# Patient Record
Sex: Female | Born: 1960 | Race: Black or African American | Hispanic: No | Smoking: Never smoker
Health system: Southern US, Community
[De-identification: ages and names within clinical notes are randomized; demographics above are authoritative.]

## PROBLEM LIST (undated history)

## (undated) DIAGNOSIS — Z972 Presence of dental prosthetic device (complete) (partial): Secondary | ICD-10-CM

## (undated) DIAGNOSIS — D259 Leiomyoma of uterus, unspecified: Secondary | ICD-10-CM

## (undated) DIAGNOSIS — R0981 Nasal congestion: Secondary | ICD-10-CM

## (undated) DIAGNOSIS — R002 Palpitations: Secondary | ICD-10-CM

## (undated) DIAGNOSIS — M1990 Unspecified osteoarthritis, unspecified site: Secondary | ICD-10-CM

## (undated) DIAGNOSIS — D649 Anemia, unspecified: Secondary | ICD-10-CM

## (undated) DIAGNOSIS — E119 Type 2 diabetes mellitus without complications: Secondary | ICD-10-CM

## (undated) DIAGNOSIS — I10 Essential (primary) hypertension: Secondary | ICD-10-CM

## (undated) HISTORY — DX: Leiomyoma of uterus, unspecified: D25.9

## (undated) HISTORY — PX: APPENDECTOMY: SHX54

## (undated) HISTORY — PX: UTERINE FIBROID SURGERY: SHX826

---

## 2006-02-26 ENCOUNTER — Other Ambulatory Visit: Payer: Self-pay

## 2006-02-27 ENCOUNTER — Inpatient Hospital Stay: Payer: Self-pay | Admitting: Obstetrics and Gynecology

## 2008-01-19 ENCOUNTER — Ambulatory Visit: Payer: Self-pay | Admitting: Internal Medicine

## 2008-01-21 ENCOUNTER — Ambulatory Visit: Payer: Self-pay | Admitting: Internal Medicine

## 2008-03-17 ENCOUNTER — Other Ambulatory Visit: Payer: Self-pay

## 2008-03-17 ENCOUNTER — Emergency Department: Payer: Self-pay | Admitting: Emergency Medicine

## 2009-01-10 ENCOUNTER — Ambulatory Visit: Payer: Self-pay | Admitting: Obstetrics and Gynecology

## 2011-03-12 ENCOUNTER — Emergency Department: Payer: Self-pay | Admitting: Emergency Medicine

## 2011-03-22 ENCOUNTER — Emergency Department: Payer: Self-pay | Admitting: Emergency Medicine

## 2015-01-10 DIAGNOSIS — M170 Bilateral primary osteoarthritis of knee: Secondary | ICD-10-CM | POA: Insufficient documentation

## 2015-02-07 ENCOUNTER — Emergency Department: Payer: Self-pay | Admitting: Student

## 2015-02-16 ENCOUNTER — Ambulatory Visit: Payer: Self-pay | Admitting: Family Medicine

## 2015-02-18 ENCOUNTER — Encounter (HOSPITAL_COMMUNITY): Payer: Self-pay | Admitting: Emergency Medicine

## 2015-02-18 ENCOUNTER — Emergency Department (HOSPITAL_COMMUNITY)
Admission: EM | Admit: 2015-02-18 | Discharge: 2015-02-18 | Disposition: A | Discharge status: Home / Self Care | Payer: BLUE CROSS/BLUE SHIELD | Attending: Emergency Medicine | Admitting: Emergency Medicine

## 2015-02-18 ENCOUNTER — Emergency Department (HOSPITAL_COMMUNITY): Payer: BLUE CROSS/BLUE SHIELD

## 2015-02-18 DIAGNOSIS — J069 Acute upper respiratory infection, unspecified: Secondary | ICD-10-CM | POA: Diagnosis not present

## 2015-02-18 DIAGNOSIS — R739 Hyperglycemia, unspecified: Secondary | ICD-10-CM

## 2015-02-18 DIAGNOSIS — E1165 Type 2 diabetes mellitus with hyperglycemia: Secondary | ICD-10-CM | POA: Insufficient documentation

## 2015-02-18 DIAGNOSIS — R7989 Other specified abnormal findings of blood chemistry: Secondary | ICD-10-CM

## 2015-02-18 DIAGNOSIS — R05 Cough: Secondary | ICD-10-CM

## 2015-02-18 DIAGNOSIS — R059 Cough, unspecified: Secondary | ICD-10-CM

## 2015-02-18 DIAGNOSIS — E876 Hypokalemia: Secondary | ICD-10-CM

## 2015-02-18 DIAGNOSIS — I10 Essential (primary) hypertension: Secondary | ICD-10-CM | POA: Insufficient documentation

## 2015-02-18 DIAGNOSIS — R945 Abnormal results of liver function studies: Secondary | ICD-10-CM

## 2015-02-18 HISTORY — DX: Type 2 diabetes mellitus without complications: E11.9

## 2015-02-18 HISTORY — DX: Essential (primary) hypertension: I10

## 2015-02-18 LAB — COMPREHENSIVE METABOLIC PANEL
ALT: 91 U/L — ABNORMAL HIGH (ref 0–35)
AST: 101 U/L — ABNORMAL HIGH (ref 0–37)
Albumin: 3.4 g/dL — ABNORMAL LOW (ref 3.5–5.2)
Alkaline Phosphatase: 65 U/L (ref 39–117)
Anion gap: 6 (ref 5–15)
BUN: 9 mg/dL (ref 6–23)
CO2: 28 mmol/L (ref 19–32)
Calcium: 8.9 mg/dL (ref 8.4–10.5)
Chloride: 98 mmol/L (ref 96–112)
Creatinine, Ser: 0.67 mg/dL (ref 0.50–1.10)
GFR calc Af Amer: >90 mL/min (ref >90)
GFR calc non Af Amer: >90 mL/min (ref >90)
Glucose, Bld: 243 mg/dL — ABNORMAL HIGH (ref 70–99)
Potassium: 3.2 mmol/L — ABNORMAL LOW (ref 3.5–5.1)
Sodium: 132 mmol/L — ABNORMAL LOW (ref 135–145)
Total Bilirubin: 0.7 mg/dL (ref 0.3–1.2)
Total Protein: 7 g/dL (ref 6.0–8.3)

## 2015-02-18 LAB — CBC WITH DIFFERENTIAL/PLATELET
Basophils Absolute: 0 10*3/uL (ref 0.0–0.1)
Basophils Relative: 1 % (ref 0–1)
Eosinophils Absolute: 0 10*3/uL (ref 0.0–0.7)
Eosinophils Relative: 0 % (ref 0–5)
HCT: 33.3 % — ABNORMAL LOW (ref 36.0–46.0)
Hemoglobin: 11.5 g/dL — ABNORMAL LOW (ref 12.0–15.0)
Lymphocytes Relative: 47 % — ABNORMAL HIGH (ref 12–46)
Lymphs Abs: 2.1 10*3/uL (ref 0.7–4.0)
MCH: 30.6 pg (ref 26.0–34.0)
MCHC: 34.5 g/dL (ref 30.0–36.0)
MCV: 88.6 fL (ref 78.0–100.0)
Monocytes Absolute: 0.5 10*3/uL (ref 0.1–1.0)
Monocytes Relative: 12 % (ref 3–12)
Neutro Abs: 1.8 10*3/uL (ref 1.7–7.7)
Neutrophils Relative %: 40 % — ABNORMAL LOW (ref 43–77)
Platelets: 146 10*3/uL — ABNORMAL LOW (ref 150–400)
RBC: 3.76 MIL/uL — ABNORMAL LOW (ref 3.87–5.11)
RDW: 12.6 % (ref 11.5–15.5)
WBC: 4.4 10*3/uL (ref 4.0–10.5)

## 2015-02-18 LAB — LIPASE, BLOOD: Lipase: 34 U/L (ref 11–59)

## 2015-02-18 MED ORDER — ONDANSETRON HCL 4 MG/2ML IJ SOLN
4.0000 mg | Freq: Once | INTRAMUSCULAR | Status: AC
  Administered 2015-02-18: 4 mg via INTRAVENOUS
  Filled 2015-02-18: qty 2

## 2015-02-18 MED ORDER — ONDANSETRON HCL 4 MG PO TABS: 4.0000 mg | ORAL_TABLET | Freq: Four times a day (QID) | ORAL | Status: DC

## 2015-02-18 MED ORDER — ALBUTEROL SULFATE (2.5 MG/3ML) 0.083% IN NEBU
5.0000 mg | INHALATION_SOLUTION | Freq: Once | RESPIRATORY_TRACT | Status: AC
  Administered 2015-02-18: 5 mg via RESPIRATORY_TRACT
  Filled 2015-02-18: qty 6

## 2015-02-18 MED ORDER — IPRATROPIUM BROMIDE 0.02 % IN SOLN
0.5000 mg | Freq: Once | RESPIRATORY_TRACT | Status: AC
  Administered 2015-02-18: 0.5 mg via RESPIRATORY_TRACT
  Filled 2015-02-18: qty 2.5

## 2015-02-18 MED ORDER — ALBUTEROL SULFATE HFA 108 (90 BASE) MCG/ACT IN AERS
2.0000 | INHALATION_SPRAY | RESPIRATORY_TRACT | Status: DC | PRN
  Administered 2015-02-18: 2 via RESPIRATORY_TRACT
  Filled 2015-02-18: qty 6.7

## 2015-02-18 MED ORDER — POTASSIUM CHLORIDE CRYS ER 20 MEQ PO TBCR
40.0000 meq | EXTENDED_RELEASE_TABLET | Freq: Once | ORAL | Status: AC
  Administered 2015-02-18: 40 meq via ORAL
  Filled 2015-02-18: qty 2

## 2015-02-18 MED ORDER — SODIUM CHLORIDE 0.9 % IV BOLUS (SEPSIS)
1000.0000 mL | Freq: Once | INTRAVENOUS | Status: AC
  Administered 2015-02-18: 1000 mL via INTRAVENOUS

## 2015-02-18 NOTE — Discharge Instructions (Signed)
Stop taking your simvostatin ( your cholesterol ) pill.  It is causing your liver tests to become    Read the instructions below on reasons to return to the emergency department and to learn more about your diagnosis.  Use over the counter medications for symptomatic relief as we discussed (musinex as a decongestant, Tylenol for fever/pain, Motrin/Ibuprofen for muscle aches). If prescribed a cough suppressant during your visit, do not operate heavy machinery with in 5 hours of taking this medication. Followup with your primary care doctor in 4 days if your symptoms persist.  Your more than welcome to return to the emergency department if symptoms worsen or become concerning.  Upper Respiratory Infection, Adult  An upper respiratory infection (URI) is also sometimes known as the common cold. Most people improve within 1 week, but symptoms can last up to 2 weeks. A residual cough may last even longer.   URI is most commonly caused by a virus. Viruses are NOT treated with antibiotics. You can easily spread the virus to others by oral contact. This includes kissing, sharing a glass, coughing, or sneezing. Touching your mouth or nose and then touching a surface, which is then touched by another person, can also spread the virus.   TREATMENT  Treatment is directed at relieving symptoms. There is no cure. Antibiotics are not effective, because the infection is caused by a virus, not by bacteria. Treatment may include:  Increased fluid intake. Sports drinks offer valuable electrolytes, sugars, and fluids.  Breathing heated mist or steam (vaporizer or shower).  Eating chicken soup or other clear broths, and maintaining good nutrition.  Getting plenty of rest.  Using gargles or lozenges for comfort.  Controlling fevers with ibuprofen or acetaminophen as directed by your caregiver.  Increasing usage of your inhaler if you have asthma.  Return to work when your temperature has returned to normal.   SEEK  MEDICAL CARE IF:  After the first few days, you feel you are getting worse rather than better.  You develop worsening shortness of breath, or brown or red sputum. These may be signs of pneumonia.  You develop yellow or brown nasal discharge or pain in the face, especially when you bend forward. These may be signs of sinusitis.  You develop a fever, swollen neck glands, pain with swallowing, or white areas in the back of your throat. These may be signs of strep throat.

## 2015-02-18 NOTE — ED Notes (Signed)
PA at the bedside.

## 2015-02-18 NOTE — ED Notes (Signed)
Patient returned from [place]. Debbie, NT at the bedside placing patient back on the monitor.

## 2015-02-18 NOTE — ED Provider Notes (Signed)
CSN: [id number]     Arrival date & time 02/18/15  1202 History   First MD Initiated Contact with Patient 02/18/15 1212     Chief Complaint  Patient presents with  . Cough     (Consider location/radiation/quality/duration/timing/severity/associated sxs/prior Treatment) HPI Comments: Patient presents today with a chief complaint of cough.  She reports that the cough has been present for the past 6 days and is gradually worsening.  She states that the cough is dry at times and productive of whitish colored sputum at times.  She states that she was seen at an Urgent Care two days ago and diagnosed with Pneumonia clinically and started on Levaquin.  She states that she did not have a CXR at that time.  She states that she has been taking Levaquin as directed.   She sates that she has had an intermittent fever over the past 6 days, but has not actually taken her temperature. She denies any chest pain, hemoptysis, or LE edema.  She does report mild SOB with exertion over the past 6 days.   She denies any history of COPD or Asthma.  She does not smoke and reports that she never has.  No history of DVT or PE.  Patient also reports that she has had nausea, vomiting, and diarrhea over the past 5 days.  She reports 3-4 episodes of diarrhea daily and 1-2 episodes of vomiting daily.  No blood in her emesis or blood in her stool.  She denies abdominal pain.  Denies urinary symptoms or vaginal discharge.    The history is provided by the patient.    Past Medical History  Diagnosis Date  . Diabetes mellitus without complication   . Hypertension    History reviewed. No pertinent past surgical history. History reviewed. No pertinent family history. History  Substance Use Topics  . Smoking status: Never Smoker   . Smokeless tobacco: Not on file  . Alcohol Use: No   OB History    No data available     Review of Systems  All other systems reviewed and are negative.     Allergies  Review of  patient's allergies indicates no known allergies.  Home Medications   Prior to Admission medications   Not on File   BP 126/63 mmHg  Pulse 90  Temp(Src) 98.6 F (37 C) (Oral)  Resp 16  SpO2 97% Physical Exam  Constitutional: She appears well-developed and well-nourished.  HENT:  Head: Normocephalic and atraumatic.  Mouth/Throat: Oropharynx is clear and moist.  Neck: Normal range of motion.  Cardiovascular: Normal rate, regular rhythm and normal heart sounds.   Pulmonary/Chest: Effort normal. No accessory muscle usage. No tachypnea. No respiratory distress. She has decreased breath sounds.  Breath sounds decreased with rhonchi at the bases of both lungs  Abdominal: Soft. Bowel sounds are normal. She exhibits no distension and no mass. There is no tenderness. There is no rebound and no guarding.  Musculoskeletal: Normal range of motion.  Neurological: She is alert.  Skin: Skin is warm and dry.  Nursing note and vitals reviewed.   ED Course  Procedures (including critical care time) Labs Review Labs Reviewed  CBC WITH DIFFERENTIAL/PLATELET - Abnormal; Notable for the following:    RBC 3.76 (*)    Hemoglobin 11.5 (*)    HCT 33.3 (*)    Platelets 146 (*)    Neutrophils Relative % 40 (*)    Lymphocytes Relative 47 (*)    All other components within normal  limits  COMPREHENSIVE METABOLIC PANEL - Abnormal; Notable for the following:    Sodium 132 (*)    Potassium 3.2 (*)    Glucose, Bld 243 (*)    Albumin 3.4 (*)    AST 101 (*)    ALT 91 (*)    All other components within normal limits  LIPASE, BLOOD    Imaging Review No results found.   EKG Interpretation None     3:00 PM Patient reports that SOB has improved after breathing treatment.  Lungs CTAB.  Abdomen is soft and non tender.  Patient tolerating PO liquids.  MDM   Final diagnoses:  Cough  Patient presents today with a cough x 6 days.  CXR negative.  No hypoxia or signs of respiratory distress.  Breath  sounds decreased with some rhonchi on exam.  Symptoms improved after given Albuterol.  She is currently being treated with Levaquin for the past 3 days for suspected Pneumonia.  Patient also complaining of nausea, vomiting, and diarrhea.  Abdomen soft and non tender.  Labs showing no leukocytosis, but mild Hypokalemia and hyperglycemia.  Anion gap is 6.  LFT's elevated.  Patient was recently started on Simvastatin on 01/25/15.  Suspect this is the reason for the elevated LFT's.  Patient instructed to stop taking the Simvastatin and take a baby aspirin daily.  Instructed to follow up with PCP regarding this.  Nausea improved during ED course.  Patient able to tolerate PO liquids.  Feel that the patient is stable for discharge.      Hyman Bible, PA-C 02/20/15 2321  Dot Lanes, MD 02/27/15 [phone]

## 2015-02-18 NOTE — ED Notes (Signed)
Pt sts flu like sx and cough x 1 week; crackles noted and pt sts some fever at times

## 2015-02-18 NOTE — ED Notes (Signed)
PA student at the bedside.  

## 2015-03-10 ENCOUNTER — Ambulatory Visit: Payer: Self-pay | Admitting: Internal Medicine

## 2015-08-15 ENCOUNTER — Encounter: Payer: Self-pay | Admitting: Podiatry

## 2015-08-15 ENCOUNTER — Ambulatory Visit (INDEPENDENT_AMBULATORY_CARE_PROVIDER_SITE_OTHER): Payer: BLUE CROSS/BLUE SHIELD

## 2015-08-15 ENCOUNTER — Ambulatory Visit (INDEPENDENT_AMBULATORY_CARE_PROVIDER_SITE_OTHER): Payer: BLUE CROSS/BLUE SHIELD | Admitting: Podiatry

## 2015-08-15 VITALS — BP 149/83 | HR 86 | Resp 16

## 2015-08-15 DIAGNOSIS — E119 Type 2 diabetes mellitus without complications: Secondary | ICD-10-CM

## 2015-08-15 DIAGNOSIS — B351 Tinea unguium: Secondary | ICD-10-CM | POA: Diagnosis not present

## 2015-08-15 NOTE — Progress Notes (Signed)
   Subjective:    Patient ID: Karen Anthony, female    DOB: 1961-11-24, 54 y.o.   MRN: [id number]  HPI Comments:    Diabetic since 2003 and last A1C unknown  Toe Pain    She presents today complaining of pain around the toes hallux bilateral. States that she gets regular pedicures but the toenails still hurt. She states that the nails are discolored and thickened. She also relates a history of diabetes which she states is under good control. She denies numbness and tingling to her fingers and toes.   Review of Systems  All other systems reviewed and are negative.      Objective:   Physical Exam: 54 year old black female no acute distress pulses are strongly palpable bilateral. Neurologic sensorium is intact per Semmes-Weinstein monofilament. Deep tendon reflexes are intact bilateral and muscle strength was 5 over 5 dorsiflexion plantar flexors and inverters everters all intrinsic musculature is intact. Orthopedic evaluation demonstrates all joints distal to the ankle level fall range of motion without crepitation. Cutaneous evaluation demonstrates no erythema edema cellulitis drainage or odor. No open lesions or wounds. Her toenails appear to be thick that they are polished and I am unable to see the discoloration. There is subungual debris present.        Assessment & Plan:  Assessment: Diabetes mellitus without complication and nail dystrophy of the hallux bilateral. Cannot rule out onychomycosis.  Plan: We took samples of the nail and skin today to be sent for pathologic evaluation.

## 2015-08-25 ENCOUNTER — Telehealth: Payer: Self-pay | Admitting: *Deleted

## 2015-08-25 NOTE — Telephone Encounter (Signed)
Called patient-informed her of positive fungal culture on the toenails and appointment is needed to discuss treatment. Appointment was made for September 12th at 2:30p

## 2015-09-05 ENCOUNTER — Ambulatory Visit (INDEPENDENT_AMBULATORY_CARE_PROVIDER_SITE_OTHER): Payer: BLUE CROSS/BLUE SHIELD | Admitting: Podiatry

## 2015-09-05 DIAGNOSIS — B351 Tinea unguium: Secondary | ICD-10-CM

## 2015-09-05 DIAGNOSIS — E119 Type 2 diabetes mellitus without complications: Secondary | ICD-10-CM | POA: Diagnosis not present

## 2015-09-05 LAB — CBC WITH DIFFERENTIAL/PLATELET
Basophils Absolute: 0 10*3/uL (ref 0.0–0.2)
Basos: 0 %
EOS (ABSOLUTE): 0.1 10*3/uL (ref 0.0–0.4)
Eos: 2 %
Hematocrit: 36.9 % (ref 34.0–46.6)
Hemoglobin: 12.4 g/dL (ref 11.1–15.9)
Immature Grans (Abs): 0 10*3/uL (ref 0.0–0.1)
Immature Granulocytes: 0 %
Lymphocytes Absolute: 2.7 10*3/uL (ref 0.7–3.1)
Lymphs: 48 %
MCH: 30.6 pg (ref 26.6–33.0)
MCHC: 33.6 g/dL (ref 31.5–35.7)
MCV: 91 fL (ref 79–97)
Monocytes Absolute: 0.4 10*3/uL (ref 0.1–0.9)
Monocytes: 7 %
Neutrophils Absolute: 2.4 10*3/uL (ref 1.4–7.0)
Neutrophils: 43 %
Platelets: 236 10*3/uL (ref 150–379)
RBC: 4.05 x10E6/uL (ref 3.77–5.28)
RDW: 12.9 % (ref 12.3–15.4)
WBC: 5.7 10*3/uL (ref 3.4–10.8)

## 2015-09-05 MED ORDER — TERBINAFINE HCL 250 MG PO TABS: 250.0000 mg | ORAL_TABLET | Freq: Every day | ORAL | Status: DC

## 2015-09-05 NOTE — Progress Notes (Signed)
She presents today for follow-up of her nail biopsy. She denies any changes in her past medical history medications or allergies.  Objective: Vital signs are stable she is alert and oriented 3. Pulses are palpable bilateral. Neurologic sensorium is intact since was C monofilament. Deep tendon reflexes are intact bilateral and muscle strength is 5 over 5 dorsiflexion plantar flexors and inverters and everters on physical musculatures intact. Orthopedic evaluation demonstrates pes planus bilateral. Orthopedic evaluation does not demonstrate any major osseous abnormalities. Cutaneous evaluation does demonstrates thick yellow dystrophic onychomycotic nails hallux and lesser digits bilateral. Pathology report did come back positive for nail dystrophy as well as onychomycosis.  Assessment: Onychomycosis with nail dystrophy.  Plan: Started her on Lamisil today to under 50 mg 30 #1 by mouth daily. Requested a liver profile. Follow up with her in 1 month.

## 2015-09-06 ENCOUNTER — Telehealth: Payer: Self-pay | Admitting: *Deleted

## 2015-09-06 LAB — HEPATIC FUNCTION PANEL
ALT: 41 IU/L — ABNORMAL HIGH (ref 0–32)
AST: 35 IU/L (ref 0–40)
Albumin: 4.3 g/dL (ref 3.5–5.5)
Alkaline Phosphatase: 110 IU/L (ref 39–117)
Bilirubin Total: 0.3 mg/dL (ref 0.0–1.2)
Bilirubin, Direct: 0.09 mg/dL (ref 0.00–0.40)
Total Protein: 7.4 g/dL (ref 6.0–8.5)

## 2015-09-06 NOTE — Telephone Encounter (Addendum)
-----   Message from Garrel Ridgel, [place] sent at 09/06/2015  8:04 AM EDT ----- Blood work looks ok and will watch ALT next visit.  Left message informing pt of Dr. Stephenie Acres orders to begin her medication.

## 2015-12-01 ENCOUNTER — Ambulatory Visit
Admission: RE | Admit: 2015-12-01 | Payer: BLUE CROSS/BLUE SHIELD | Source: Ambulatory Visit | Admitting: Gastroenterology

## 2015-12-01 ENCOUNTER — Encounter: Admission: RE | Payer: Self-pay | Source: Ambulatory Visit

## 2015-12-01 SURGERY — COLONOSCOPY WITH PROPOFOL
Anesthesia: General

## 2015-12-21 ENCOUNTER — Encounter: Payer: Self-pay | Admitting: Podiatry

## 2015-12-21 ENCOUNTER — Encounter (INDEPENDENT_AMBULATORY_CARE_PROVIDER_SITE_OTHER): Payer: BLUE CROSS/BLUE SHIELD | Admitting: Podiatry

## 2015-12-21 NOTE — Progress Notes (Signed)
This encounter was created in error - please disregard.

## 2016-01-16 ENCOUNTER — Ambulatory Visit: Payer: BLUE CROSS/BLUE SHIELD | Admitting: Cardiovascular Disease

## 2016-03-07 ENCOUNTER — Ambulatory Visit (INDEPENDENT_AMBULATORY_CARE_PROVIDER_SITE_OTHER): Payer: BLUE CROSS/BLUE SHIELD | Admitting: Podiatry

## 2016-03-07 ENCOUNTER — Encounter: Payer: Self-pay | Admitting: Podiatry

## 2016-03-07 VITALS — BP 151/87 | HR 90 | Resp 18

## 2016-03-07 DIAGNOSIS — B351 Tinea unguium: Secondary | ICD-10-CM

## 2016-03-07 MED ORDER — TERBINAFINE HCL 250 MG PO TABS: 250.0000 mg | ORAL_TABLET | Freq: Every day | ORAL | Status: DC

## 2016-03-07 NOTE — Progress Notes (Signed)
She presents today concerned that she is redeveloping fungus to the hallux nail bilaterally. She states this seems to be getting thicker and more discolored over the past several months. She states that after taking Lamisil last time the majority of this grew out but there was some remaining and she's concerned after having researched nail fungus that recurrence is high.  Objective: Vital signs are stable she is alert and oriented 3. Pulses are palpable. No dystrophy probable onychomycosis distal aspect of the hallux nail plates bilaterally there is mild discoloration and some thickening of the nail it very well may be nail dystrophy but it did grow out initially with Lamisil then more than likely it'll grow out again.  Assessment onychomycosis hallux bilateral.  Plan: Started her on 2 months of oral Lamisil therapy and I will follow-up with her as needed.

## 2016-06-11 DIAGNOSIS — K5904 Chronic idiopathic constipation: Secondary | ICD-10-CM | POA: Insufficient documentation

## 2016-06-11 DIAGNOSIS — E663 Overweight: Secondary | ICD-10-CM | POA: Insufficient documentation

## 2016-06-11 DIAGNOSIS — R1013 Epigastric pain: Secondary | ICD-10-CM | POA: Insufficient documentation

## 2016-06-14 ENCOUNTER — Ambulatory Visit: Payer: Self-pay | Admitting: Ophthalmology

## 2016-06-22 DIAGNOSIS — E119 Type 2 diabetes mellitus without complications: Secondary | ICD-10-CM | POA: Insufficient documentation

## 2016-07-23 ENCOUNTER — Other Ambulatory Visit: Payer: Self-pay

## 2016-07-24 ENCOUNTER — Other Ambulatory Visit: Payer: Self-pay

## 2016-07-24 ENCOUNTER — Encounter: Payer: Self-pay | Admitting: Gastroenterology

## 2016-07-24 ENCOUNTER — Ambulatory Visit (INDEPENDENT_AMBULATORY_CARE_PROVIDER_SITE_OTHER): Payer: BLUE CROSS/BLUE SHIELD | Admitting: Gastroenterology

## 2016-07-24 VITALS — BP 123/67 | HR 78 | Temp 98.3°F | Ht 72.0 in | Wt 207.0 lb

## 2016-07-24 DIAGNOSIS — R1013 Epigastric pain: Secondary | ICD-10-CM | POA: Diagnosis not present

## 2016-07-24 NOTE — Progress Notes (Signed)
Gastroenterology Consultation  Referring Provider:     Jodi Marble, MD Primary Care Physician:  Karen Napoleon, MD Primary Gastroenterologist:  Dr. Allen Anthony     Reason for Consultation:     Karen Anthony positive        HPI:   Karen Anthony is a 55 y.o. y/o female referred for consultation & management of H Anthony positive by Karen Anthony, [facility] AHMED, MD.  This patient comes today after being found to have Karen Anthony antibody positive on a breath test.  The patient reports that she had the breath test done because of abdominal pain.  The patient states that the abdominal pain is not associated with any eating or drinking.  She also denies abdominal pain to be associated with bowel movements.  The patient states that she was given a prescription for the medication to eradicate the Karen Anthony but when she went to pick it up it was too expensive for her to get it therefore she has not been treated.  The report that she was set up for a colonoscopy last November but never went through with it.  She has never had a colonoscopy.  Past Medical History:  Diagnosis Date  . Diabetes mellitus without complication ([place])   . Hypertension     History reviewed. No pertinent surgical history.  Prior to Admission medications   Medication Sig Start Date End Date Taking? Authorizing Provider  fluticasone (FLONASE) 50 MCG/ACT nasal spray  05/22/16  Yes Historical Provider, MD  KOMBIGLYZE XR 2.04-999 MG TB24 Take 2 tablets by mouth daily.  01/25/15  Yes Historical Provider, MD  lisinopril (PRINIVIL,ZESTRIL) 20 MG tablet Take 20 mg by mouth every morning.  01/25/15  Yes Historical Provider, MD  naproxen (NAPROSYN) 500 MG tablet Take 500 mg by mouth 2 (two) times daily with a meal.  01/10/15  Yes Historical Provider, MD  diclofenac sodium (VOLTAREN) 1 % GEL  08/08/15   Historical Provider, MD  glyBURIDE (DIABETA) 5 MG tablet Take 10 mg by mouth 2 (two) times daily with a meal.  01/25/15   Historical  Provider, MD  metFORMIN (GLUCOPHAGE-XR) 500 MG 24 hr tablet  08/08/15   Historical Provider, MD  terbinafine (LAMISIL) 250 MG tablet Take 1 tablet (250 mg total) by mouth daily. Patient not taking: Reported on 07/24/2016 09/05/15   Max T Hyatt, DPM  terbinafine (LAMISIL) 250 MG tablet Take 1 tablet (250 mg total) by mouth daily. Patient not taking: Reported on 07/24/2016 03/07/16   [place], DPM    History reviewed. No pertinent family history.   Social History  Substance Use Topics  . Smoking status: Never Smoker  . Smokeless tobacco: Never Used  . Alcohol use No    Allergies as of 07/24/2016  . (No Known Allergies)    Review of Systems:    All systems reviewed and negative except where noted in HPI.   Physical Exam:  BP 123/67   Pulse 78   Temp 98.3 F (36.8 C) (Oral)   Ht 6' (1.829 m)   Wt 207 lb (93.9 kg)   BMI 28.07 kg/m  No LMP recorded. Psych:  Alert and cooperative. Normal mood and affect. General:   Alert,  Well-developed, well-nourished, pleasant and cooperative in NAD Head:  Normocephalic and atraumatic. Eyes:  Sclera clear, no icterus.   Conjunctiva pink. Ears:  Normal auditory acuity. Nose:  No deformity, discharge, or lesions. Mouth:  No deformity or lesions,oropharynx pink & moist.  Neck:  Supple; no masses or thyromegaly. Lungs:  Respirations even and unlabored.  Clear throughout to auscultation.   No wheezes, crackles, or rhonchi. No acute distress. Heart:  Regular rate and rhythm; no murmurs, clicks, rubs, or gallops. Abdomen:  Normal bowel sounds.  No bruits.  Soft, non-tender and non-distended without masses, hepatosplenomegaly or hernias noted.  No guarding or rebound tenderness.  Negative Carnett sign.   Rectal:  Deferred.  Msk:  Symmetrical without gross deformities.  Good, equal movement & strength bilaterally. Pulses:  Normal pulses noted. Extremities:  No clubbing or edema.  No cyanosis. Neurologic:  Alert and oriented x3;  grossly normal  neurologically. Skin:  Intact without significant lesions or rashes.  No jaundice. Lymph Nodes:  No significant cervical adenopathy. Psych:  Alert and cooperative. Normal mood and affect.  Imaging Studies: No results found.  Assessment and Plan:   Karen Anthony is a 55 y.o. y/o female who was found to have Karen Anthony on a breath test. The patient was not treated because the medication was too expensive.  The patient also has epigastric pain and has never had a colonoscopy.  The patient will be given samples of medication for her Karen Anthony.  The patient will also be set up for an EGD and colonoscopy due to her epigastric pain and need for screening. I have discussed risks & benefits which include, but are not limited to, bleeding, infection, perforation & drug reaction.  The patient agrees with this plan & written consent will be obtained.      Note: This dictation was prepared with Dragon dictation along with smaller phrase technology. Any transcriptional errors that result from this process are unintentional.

## 2016-07-27 ENCOUNTER — Ambulatory Visit
Admission: RE | Admit: 2016-07-27 | Discharge: 2016-07-27 | Disposition: A | Discharge status: Home / Self Care | Payer: BLUE CROSS/BLUE SHIELD | Source: Ambulatory Visit | Attending: Otolaryngology | Admitting: Otolaryngology

## 2016-07-27 ENCOUNTER — Other Ambulatory Visit: Payer: Self-pay | Admitting: Otolaryngology

## 2016-07-27 DIAGNOSIS — J310 Chronic rhinitis: Secondary | ICD-10-CM

## 2016-07-27 DIAGNOSIS — J3489 Other specified disorders of nose and nasal sinuses: Secondary | ICD-10-CM

## 2016-07-27 DIAGNOSIS — J301 Allergic rhinitis due to pollen: Secondary | ICD-10-CM | POA: Diagnosis present

## 2016-07-27 DIAGNOSIS — R0981 Nasal congestion: Secondary | ICD-10-CM | POA: Insufficient documentation

## 2016-07-30 ENCOUNTER — Ambulatory Visit (INDEPENDENT_AMBULATORY_CARE_PROVIDER_SITE_OTHER): Payer: BLUE CROSS/BLUE SHIELD | Admitting: Obstetrics & Gynecology

## 2016-07-30 ENCOUNTER — Encounter: Payer: Self-pay | Admitting: Obstetrics & Gynecology

## 2016-07-30 VITALS — BP 132/71 | HR 90 | Ht 71.0 in | Wt 208.0 lb

## 2016-07-30 DIAGNOSIS — N949 Unspecified condition associated with female genital organs and menstrual cycle: Secondary | ICD-10-CM | POA: Diagnosis not present

## 2016-07-30 DIAGNOSIS — D259 Leiomyoma of uterus, unspecified: Secondary | ICD-10-CM | POA: Diagnosis not present

## 2016-07-30 DIAGNOSIS — G8929 Other chronic pain: Secondary | ICD-10-CM | POA: Diagnosis not present

## 2016-07-30 DIAGNOSIS — R102 Pelvic and perineal pain: Secondary | ICD-10-CM

## 2016-07-30 NOTE — Progress Notes (Signed)
   CLINIC ENCOUNTER NOTE  History:  55 y.o. PMP here today for evaluation of constant pelvic pain and history of fibroids. Had myomectomy in 2008.  Feels fibroids are causing significant pain especially during intercourse. She denies any abnormal vaginal discharge, bleeding or other concerns.   Past Medical History:  Diagnosis Date  . Diabetes mellitus without complication ([place])   . Hypertension   . Uterine fibroid     Past Surgical History:  Procedure Laterality Date  . APPENDECTOMY    . CESAREAN SECTION     3 times  . UTERINE FIBROID SURGERY      The following portions of the patient's history were reviewed and updated as appropriate: allergies, current medications, past family history, past medical history, past social history, past surgical history and problem list.   Health Maintenance:  Normal pap and negative HRHPV in 2016.  Normal mammogram on 03/10/2015.   Review of Systems:  Pertinent items noted in HPI and remainder of comprehensive ROS otherwise negative.  Objective:  Physical Exam BP 132/71   Pulse 90   Ht 5\' 11"  (1.803 m)   Wt 208 lb (94.3 kg)   BMI 29.01 kg/m  CONSTITUTIONAL: Well-developed, well-nourished female in no acute distress.  HENT:  Normocephalic, atraumatic. External right and left ear normal. Oropharynx is clear and moist EYES: Conjunctivae and EOM are normal. Pupils are equal, round, and reactive to light. No scleral icterus.  NECK: Normal range of motion, supple, no masses SKIN: Skin is warm and dry. No rash noted. Not diaphoretic. No erythema. No pallor. NEUROLOGIC: Alert and oriented to person, place, and time. Normal reflexes, muscle tone coordination. No cranial nerve deficit noted. PSYCHIATRIC: Normal mood and affect. Normal behavior. Normal judgment and thought content. CARDIOVASCULAR: Normal heart rate noted RESPIRATORY: Effort and breath sounds normal, no problems with respiration noted ABDOMEN: Soft, no distention noted.   PELVIC:  Normal appearing external genitalia; normal appearing vaginal mucosa and cervix.  No abnormal discharge noted.  Unable to palpate uterus and adnexa due to habitus; but pain elicited on uterine palpation.   MUSCULOSKELETAL: Normal range of motion. No edema noted.   Assessment & Plan:  1. Uterine leiomyoma, unspecified location 2. Chronic female pelvic pain Already on Naproxen for knee pain, recommended addition of extra strength Tylenol. - US Pelvis Complete; Future - US Transvaginal Non-OB; Future Will follow up results and manage accordingly. Routine preventative health maintenance measures emphasized. Please refer to After Visit Summary for other counseling recommendations.    Total face-to-face time with patient: 20 minutes. Over 50% of encounter was spent on counseling and coordination of care.   Verita Schneiders, MD, [place] Attending [place], [facility] for [employer], [place]

## 2016-07-30 NOTE — Patient Instructions (Addendum)
Thank you for enrolling in [facility]. Please follow the instructions below to securely access your online medical record. MyChart allows you to send messages to your doctor, view your test results, manage appointments, and more.   How Do I Sign Up? 1. In your Internet browser, go to [employer] and enter [email or web address]. 2. Click on the Sign Up Now link in the Sign In box. You will see the New Member Sign Up page. 3. Enter your MyChart Access Code exactly as it appears below. You will not need to use this code after you've completed the sign-up process. If you do not sign up before the expiration date, you must request a new code.  MyChart Access Code: [email or web address] Expires: 08/13/2016  1:35 PM  4. Enter your Social Security Number ([id number]) and Date of Birth (mm/dd/yyyy) as indicated and click Submit. You will be taken to the next sign-up page. 5. Create a MyChart ID. This will be your MyChart login ID and cannot be changed, so think of one that is secure and easy to remember. 6. Create a MyChart password. You can change your password at any time. 7. Enter your Password Reset Question and Answer. This can be used at a later time if you forget your password.  8. Enter your e-mail address. You will receive e-mail notification when new information is available in [facility]. 9. Click Sign Up. You can now view your medical record.   Additional Information Remember, MyChart is NOT to be used for urgent needs. For medical emergencies, dial 911.      Uterine Fibroids Uterine fibroids are tissue masses (tumors) that can develop in the womb (uterus). They are also called leiomyomas. This type of tumor is not cancerous (benign) and does not spread to other parts of the body outside of the pelvic area, which is between the hip bones. Occasionally, fibroids may develop in the fallopian tubes, in the cervix, or on the support structures (ligaments) that surround the uterus. You can  have one or many fibroids. Fibroids can vary in size, weight, and where they grow in the uterus. Some can become quite large. Most fibroids do not require medical treatment. CAUSES A fibroid can develop when a single uterine cell keeps growing (replicating). Most cells in the human body have a control mechanism that keeps them from replicating without control. SIGNS AND SYMPTOMS Symptoms may include:   Heavy bleeding during your period.  Bleeding or spotting between periods.  Pelvic pain and pressure.  Bladder problems, such as needing to urinate more often (urinary frequency) or urgently.  Inability to reproduce offspring (infertility).  Miscarriages. DIAGNOSIS Uterine fibroids are diagnosed through a physical exam. Your health care provider may feel the lumpy tumors during a pelvic exam. Ultrasonography and an MRI may be done to determine the size, location, and number of fibroids. TREATMENT Treatment may include:  Watchful waiting. This involves getting the fibroid checked by your health care provider to see if it grows or shrinks. Follow your health care provider's recommendations for how often to have this checked.  Hormone medicines. These can be taken by mouth or given through an intrauterine device (IUD).  Surgery.  Removing the fibroids (myomectomy) or the uterus (hysterectomy).  Removing blood supply to the fibroids (uterine artery embolization). If fibroids interfere with your fertility and you want to become pregnant, your health care provider may recommend having the fibroids removed.  HOME CARE INSTRUCTIONS  Keep all follow-up visits as directed by your health care provider. This  is important.  Take medicines only as directed by your health care provider.  If you were prescribed a hormone treatment, take the hormone medicines exactly as directed.  Do not take aspirin, because it can cause bleeding.  Ask your health care provider about taking iron pills and  increasing the amount of dark green, leafy vegetables in your diet. These actions can help to boost your blood iron levels, which may be affected by heavy menstrual bleeding.  Pay close attention to your period and tell your health care provider about any changes, such as:  Increased blood flow that requires you to use more pads or tampons than usual per month.  A change in the number of days that your period lasts per month.  A change in symptoms that are associated with your period, such as abdominal cramping or back pain. SEEK MEDICAL CARE IF:  You have pelvic pain, back pain, or abdominal cramps that cannot be controlled with medicines.  You have an increase in bleeding between and during periods.  You soak tampons or pads in a half hour or less.  You feel lightheaded, extra tired, or weak. SEEK IMMEDIATE MEDICAL CARE IF:  You faint.  You have a sudden increase in pelvic pain.   This information is not intended to replace advice given to you by your health care provider. Make sure you discuss any questions you have with your health care provider.   Document Released: 12/07/2000 Document Revised: 12/31/2014 Document Reviewed: 06/08/2014 Elsevier Interactive Patient Education [employer].

## 2016-08-06 ENCOUNTER — Ambulatory Visit
Admission: RE | Admit: 2016-08-06 | Discharge: 2016-08-06 | Disposition: A | Discharge status: Home / Self Care | Payer: BLUE CROSS/BLUE SHIELD | Source: Ambulatory Visit | Attending: Obstetrics & Gynecology | Admitting: Obstetrics & Gynecology

## 2016-08-06 DIAGNOSIS — D259 Leiomyoma of uterus, unspecified: Secondary | ICD-10-CM | POA: Diagnosis not present

## 2016-08-06 DIAGNOSIS — G8929 Other chronic pain: Secondary | ICD-10-CM | POA: Diagnosis not present

## 2016-08-06 DIAGNOSIS — N949 Unspecified condition associated with female genital organs and menstrual cycle: Secondary | ICD-10-CM | POA: Insufficient documentation

## 2016-08-06 DIAGNOSIS — R102 Pelvic and perineal pain: Secondary | ICD-10-CM

## 2016-08-09 ENCOUNTER — Telehealth: Payer: Self-pay | Admitting: *Deleted

## 2016-08-09 NOTE — Telephone Encounter (Signed)
Message left for patient to call to make appointment with Dr Harolyn Rutherford to discuss plan of care s/p ultrasound.

## 2016-08-28 ENCOUNTER — Encounter: Payer: Self-pay | Admitting: *Deleted

## 2016-08-31 NOTE — Discharge Instructions (Signed)

## 2016-09-03 ENCOUNTER — Ambulatory Visit: Payer: BLUE CROSS/BLUE SHIELD | Admitting: Anesthesiology

## 2016-09-03 ENCOUNTER — Encounter: Payer: Self-pay | Admitting: Gastroenterology

## 2016-09-03 ENCOUNTER — Encounter
Admission: RE | Disposition: A | Discharge status: Home / Self Care | Payer: Self-pay | Source: Ambulatory Visit | Attending: Gastroenterology

## 2016-09-03 ENCOUNTER — Ambulatory Visit
Admission: RE | Admit: 2016-09-03 | Discharge: 2016-09-03 | Disposition: A | Discharge status: Home / Self Care | Payer: BLUE CROSS/BLUE SHIELD | Source: Ambulatory Visit | Attending: Gastroenterology | Admitting: Gastroenterology

## 2016-09-03 DIAGNOSIS — R1013 Epigastric pain: Secondary | ICD-10-CM | POA: Diagnosis not present

## 2016-09-03 DIAGNOSIS — Z1211 Encounter for screening for malignant neoplasm of colon: Secondary | ICD-10-CM | POA: Insufficient documentation

## 2016-09-03 DIAGNOSIS — K2950 Unspecified chronic gastritis without bleeding: Secondary | ICD-10-CM | POA: Insufficient documentation

## 2016-09-03 DIAGNOSIS — I10 Essential (primary) hypertension: Secondary | ICD-10-CM | POA: Diagnosis not present

## 2016-09-03 DIAGNOSIS — Z9889 Other specified postprocedural states: Secondary | ICD-10-CM | POA: Insufficient documentation

## 2016-09-03 DIAGNOSIS — Z791 Long term (current) use of non-steroidal anti-inflammatories (NSAID): Secondary | ICD-10-CM | POA: Insufficient documentation

## 2016-09-03 DIAGNOSIS — K621 Rectal polyp: Secondary | ICD-10-CM

## 2016-09-03 DIAGNOSIS — M170 Bilateral primary osteoarthritis of knee: Secondary | ICD-10-CM | POA: Diagnosis not present

## 2016-09-03 DIAGNOSIS — Z7984 Long term (current) use of oral hypoglycemic drugs: Secondary | ICD-10-CM | POA: Insufficient documentation

## 2016-09-03 DIAGNOSIS — Z833 Family history of diabetes mellitus: Secondary | ICD-10-CM | POA: Insufficient documentation

## 2016-09-03 DIAGNOSIS — Z79899 Other long term (current) drug therapy: Secondary | ICD-10-CM | POA: Diagnosis not present

## 2016-09-03 DIAGNOSIS — M19042 Primary osteoarthritis, left hand: Secondary | ICD-10-CM | POA: Diagnosis not present

## 2016-09-03 DIAGNOSIS — M19041 Primary osteoarthritis, right hand: Secondary | ICD-10-CM | POA: Insufficient documentation

## 2016-09-03 DIAGNOSIS — D128 Benign neoplasm of rectum: Secondary | ICD-10-CM | POA: Diagnosis not present

## 2016-09-03 DIAGNOSIS — E119 Type 2 diabetes mellitus without complications: Secondary | ICD-10-CM | POA: Insufficient documentation

## 2016-09-03 DIAGNOSIS — Z800 Family history of malignant neoplasm of digestive organs: Secondary | ICD-10-CM | POA: Diagnosis not present

## 2016-09-03 HISTORY — DX: Anemia, unspecified: D64.9

## 2016-09-03 HISTORY — DX: Nasal congestion: R09.81

## 2016-09-03 HISTORY — PX: COLONOSCOPY WITH PROPOFOL: SHX5780

## 2016-09-03 HISTORY — DX: Palpitations: R00.2

## 2016-09-03 HISTORY — PX: ESOPHAGOGASTRODUODENOSCOPY (EGD) WITH PROPOFOL: SHX5813

## 2016-09-03 HISTORY — DX: Unspecified osteoarthritis, unspecified site: M19.90

## 2016-09-03 HISTORY — DX: Presence of dental prosthetic device (complete) (partial): Z97.2

## 2016-09-03 LAB — GLUCOSE, CAPILLARY
Glucose-Capillary: 124 mg/dL — ABNORMAL HIGH (ref 65–99)
Glucose-Capillary: 96 mg/dL (ref 65–99)

## 2016-09-03 SURGERY — COLONOSCOPY WITH PROPOFOL
Anesthesia: Monitor Anesthesia Care | Wound class: Contaminated

## 2016-09-03 MED ORDER — ACETAMINOPHEN 325 MG PO TABS: 325.0000 mg | ORAL_TABLET | ORAL | Status: DC | PRN

## 2016-09-03 MED ORDER — SIMETHICONE 40 MG/0.6ML PO SUSP
ORAL | Status: DC | PRN
  Administered 2016-09-03: 10:00:00

## 2016-09-03 MED ORDER — LACTATED RINGERS IV SOLN
INTRAVENOUS | Status: DC
  Administered 2016-09-03: 09:00:00 via INTRAVENOUS

## 2016-09-03 MED ORDER — ACETAMINOPHEN 160 MG/5ML PO SOLN: 325.0000 mg | ORAL | Status: DC | PRN

## 2016-09-03 MED ORDER — GLYCOPYRROLATE 0.2 MG/ML IJ SOLN
INTRAMUSCULAR | Status: DC | PRN
  Administered 2016-09-03: 0.2 mg via INTRAVENOUS

## 2016-09-03 MED ORDER — LIDOCAINE HCL (CARDIAC) 20 MG/ML IV SOLN
INTRAVENOUS | Status: DC | PRN
  Administered 2016-09-03: 50 mg via INTRAVENOUS

## 2016-09-03 MED ORDER — PROPOFOL 10 MG/ML IV BOLUS
INTRAVENOUS | Status: DC | PRN
  Administered 2016-09-03: 50 mg via INTRAVENOUS
  Administered 2016-09-03: 100 mg via INTRAVENOUS
  Administered 2016-09-03 (×2): 30 mg via INTRAVENOUS
  Administered 2016-09-03: 50 mg via INTRAVENOUS
  Administered 2016-09-03: 40 mg via INTRAVENOUS
  Administered 2016-09-03: 50 mg via INTRAVENOUS

## 2016-09-03 SURGICAL SUPPLY — 35 items
BALLN DILATOR 10-12 8 (BALLOONS)
BALLN DILATOR 12-15 8 (BALLOONS)
BALLN DILATOR 15-18 8 (BALLOONS)
BALLN DILATOR CRE 0-12 8 (BALLOONS)
BALLN DILATOR ESOPH 8 10 CRE (MISCELLANEOUS) IMPLANT
BALLOON DILATOR 12-15 8 (BALLOONS) IMPLANT
BALLOON DILATOR 15-18 8 (BALLOONS) IMPLANT
BALLOON DILATOR CRE 0-12 8 (BALLOONS) IMPLANT
BLOCK BITE 60FR ADLT L/F GRN (MISCELLANEOUS) ×3 IMPLANT
CANISTER SUCT 1200ML W/VALVE (MISCELLANEOUS) ×3 IMPLANT
CLIP HMST 235XBRD CATH ROT (MISCELLANEOUS) IMPLANT
CLIP RESOLUTION 360 11X235 (MISCELLANEOUS)
FCP ESCP3.2XJMB 240X2.8X (MISCELLANEOUS)
FORCEPS BIOP RAD 4 LRG CAP 4 (CUTTING FORCEPS) ×3 IMPLANT
FORCEPS BIOP RJ4 240 W/NDL (MISCELLANEOUS)
FORCEPS ESCP3.2XJMB 240X2.8X (MISCELLANEOUS) IMPLANT
GOWN CVR UNV OPN BCK APRN NK (MISCELLANEOUS) ×2 IMPLANT
GOWN ISOL THUMB LOOP REG UNIV (MISCELLANEOUS) ×4
INJECTOR VARIJECT VIN23 (MISCELLANEOUS) IMPLANT
KIT DEFENDO VALVE AND CONN (KITS) IMPLANT
KIT ENDO PROCEDURE OLY (KITS) ×3 IMPLANT
MARKER SPOT ENDO TATTOO 5ML (MISCELLANEOUS) IMPLANT
PAD GROUND ADULT SPLIT (MISCELLANEOUS) IMPLANT
PROBE APC STR FIRE (PROBE) IMPLANT
RETRIEVER NET PLAT FOOD (MISCELLANEOUS) IMPLANT
RETRIEVER NET ROTH 2.5X230 LF (MISCELLANEOUS) IMPLANT
SNARE SHORT THROW 13M SML OVAL (MISCELLANEOUS) ×3 IMPLANT
SNARE SHORT THROW 30M LRG OVAL (MISCELLANEOUS) IMPLANT
SNARE SNG USE RND 15MM (INSTRUMENTS) IMPLANT
SPOT EX ENDOSCOPIC TATTOO (MISCELLANEOUS)
SYR INFLATION 60ML (SYRINGE) IMPLANT
TRAP ETRAP POLY (MISCELLANEOUS) ×3 IMPLANT
VARIJECT INJECTOR VIN23 (MISCELLANEOUS)
WATER STERILE IRR 250ML POUR (IV SOLUTION) ×3 IMPLANT
WIRE CRE 18-20MM 8CM F G (MISCELLANEOUS) IMPLANT

## 2016-09-03 NOTE — Anesthesia Preprocedure Evaluation (Signed)
Anesthesia Evaluation  Patient identified by MRN, date of birth, ID band Patient awake    Reviewed: Allergy & Precautions, H&P , NPO status , Patient's Chart, lab work & pertinent test results  Airway Mallampati: II  TM Distance: >3 FB Neck ROM: full    Dental  (+) Upper Dentures, Loose,    Pulmonary    Pulmonary exam normal        Cardiovascular hypertension, Normal cardiovascular exam     Neuro/Psych    GI/Hepatic   Endo/Other  diabetes  Renal/GU      Musculoskeletal   Abdominal   Peds  Hematology   Anesthesia Other Findings   Reproductive/Obstetrics                             Anesthesia Physical Anesthesia Plan  ASA: II  Anesthesia Plan: MAC   Post-op Pain Management:    Induction:   Airway Management Planned:   Additional Equipment:   Intra-op Plan:   Post-operative Plan:   Informed Consent: I have reviewed the patients History and Physical, chart, labs and discussed the procedure including the risks, benefits and alternatives for the proposed anesthesia with the patient or authorized representative who has indicated his/her understanding and acceptance.     Plan Discussed with:   Anesthesia Plan Comments:         Anesthesia Quick Evaluation

## 2016-09-03 NOTE — Anesthesia Procedure Notes (Signed)
Procedure Name: MAC Performed by: Caylee Vlachos Pre-anesthesia Checklist: Patient identified, Emergency Drugs available, Suction available, Timeout performed and Patient being monitored Patient Re-evaluated:Patient Re-evaluated prior to inductionOxygen Delivery Method: Nasal cannula Placement Confirmation: positive ETCO2     

## 2016-09-03 NOTE — Anesthesia Procedure Notes (Signed)
Performed by: Amadi Frady       

## 2016-09-03 NOTE — H&P (Signed)
  Karen Lame, MD [facility] [address], [place], [place] [address] Phone: [phone] Fax : [phone]  Primary Care Physician:  Volanda Napoleon, MD Primary Gastroenterologist:  Dr. Allen Norris  Pre-Procedure History & Physical: HPI:  Tolanda Mielcarek is a 55 y.o. female is here for an endoscopy and colonoscopy.   Past Medical History:  Diagnosis Date  . Anemia    Hx  . Arthritis    knees, hands  . Diabetes mellitus without complication ([place])   . Hypertension   . Palpitations   . Sinus congestion   . Uterine fibroid   . Wears dentures    full upper    Past Surgical History:  Procedure Laterality Date  . APPENDECTOMY    . CESAREAN SECTION     3 times  . UTERINE FIBROID SURGERY      Prior to Admission medications   Medication Sig Start Date End Date Taking? Authorizing Provider  Black Pepper-Turmeric (TURMERIC COMPLEX/BLACK PEPPER PO) Take by mouth.   Yes Historical Provider, MD  lisinopril (PRINIVIL,ZESTRIL) 20 MG tablet Take 20 mg by mouth every morning.  01/25/15  Yes Historical Provider, MD  metFORMIN (GLUCOPHAGE) 500 MG tablet Take 500 mg by mouth 2 (two) times daily with a meal.   Yes Historical Provider, MD  naproxen (NAPROSYN) 500 MG tablet Take 500 mg by mouth 2 (two) times daily with a meal.  01/10/15  Yes Historical Provider, MD  KOMBIGLYZE XR 2.04-999 MG TB24 Take 2 tablets by mouth daily.  01/25/15   Historical Provider, MD    Allergies as of 07/24/2016  . (No Known Allergies)    Family History  Problem Relation Age of Onset  . Diabetes Mother   . Diabetes Father   . Colon cancer Sister     Social History   Social History  . Marital status: Legally Separated    Spouse name: N/A  . Number of children: N/A  . Years of education: N/A   Occupational History  . Not on file.   Social History Main Topics  . Smoking status: Never Smoker  . Smokeless tobacco: Never Used  . Alcohol use No  . Drug use: No  . Sexual activity: Not  Currently    Birth control/ protection: Post-menopausal   Other Topics Concern  . Not on file   Social History Narrative  . No narrative on file    Review of Systems: See HPI, otherwise negative ROS  Physical Exam: BP (!) 154/83   Pulse 68   Temp 97.1 F (36.2 C) (Tympanic)   Resp 16   Ht 5\' 11"  (1.803 m)   Wt 194 lb (88 kg)   SpO2 100%   BMI 27.06 kg/m  General:   Alert,  pleasant and cooperative in NAD Head:  Normocephalic and atraumatic. Neck:  Supple; no masses or thyromegaly. Lungs:  Clear throughout to auscultation.    Heart:  Regular rate and rhythm. Abdomen:  Soft, nontender and nondistended. Normal bowel sounds, without guarding, and without rebound.   Neurologic:  Alert and  oriented x4;  grossly normal neurologically.  Impression/Plan: Harrold Donath is here for an endoscopy and colonoscopy to be performed for screening and epigastic pain  Risks, benefits, limitations, and alternatives regarding  endoscopy and colonoscopy have been reviewed with the patient.  Questions have been answered.  All parties agreeable.   Karen Lame, MD  09/03/2016, 9:30 AM

## 2016-09-03 NOTE — Op Note (Signed)
[facility] Gastroenterology Patient Name: Karen Anthony Procedure Date: 09/03/2016 9:42 AM MRN: [id number] Account #: [id number] Date of Birth: 1961/12/10 Admit Type: Outpatient Age: 55 Room: [facility] OR ROOM 01 Gender: Female Note Status: Finalized Procedure:            Upper GI endoscopy Indications:          Epigastric abdominal pain, Follow-up of Helicobacter                        pylori Providers:            Lucilla Lame MD, MD Referring MD:         Venetia Maxon. Elijio Miles, MD (Referring MD) Medicines:            Propofol per Anesthesia Complications:        No immediate complications. Procedure:            Pre-Anesthesia Assessment:                       - Prior to the procedure, a History and Physical was                        performed, and patient medications and allergies were                        reviewed. The patient's tolerance of previous                        anesthesia was also reviewed. The risks and benefits of                        the procedure and the sedation options and risks were                        discussed with the patient. All questions were                        answered, and informed consent was obtained. Prior                        Anticoagulants: The patient has taken no previous                        anticoagulant or antiplatelet agents. ASA Grade                        Assessment: II - A patient with mild systemic disease.                        After reviewing the risks and benefits, the patient was                        deemed in satisfactory condition to undergo the                        procedure.                       After obtaining informed consent, the endoscope was  passed under direct vision. Throughout the procedure,                        the patient's blood pressure, pulse, and oxygen                        saturations were monitored continuously. The Olympus                        GIF  H180J colonscope [id number] was introduced                        through the mouth, and advanced to the second part of                        duodenum. The upper GI endoscopy was accomplished                        without difficulty. The patient tolerated the procedure                        well. Findings:      The examined esophagus was normal.      The entire examined stomach was normal. Biopsies were taken with a cold       forceps for Helicobacter pylori testing.      The examined duodenum was normal. Impression:           - Normal esophagus.                       - Normal stomach. Biopsied.                       - Normal examined duodenum. Recommendation:       - Await pathology results. Procedure Code(s):    --- Professional ---                       [phone], Esophagogastroduodenoscopy, flexible, transoral;                        with biopsy, single or multiple Diagnosis Code(s):    --- Professional ---                       R10.13, Epigastric pain CPT copyright 2016 American Medical Association. All rights reserved. The codes documented in this report are preliminary and upon coder review may  be revised to meet current compliance requirements. Lucilla Lame MD, MD 09/03/2016 9:49:31 AM This report has been signed electronically. Number of Addenda: 0 Note Initiated On: 09/03/2016 9:42 AM Total Procedure Duration: 0 hours 1 minute 47 seconds       [facility]

## 2016-09-03 NOTE — Anesthesia Postprocedure Evaluation (Signed)
Anesthesia Post Note  Patient: Karen Anthony  Procedure(s) Performed: Procedure(s) (LRB): COLONOSCOPY WITH PROPOFOL (N/A) ESOPHAGOGASTRODUODENOSCOPY (EGD) WITH PROPOFOL (N/A)  Patient location during evaluation: PACU Anesthesia Type: MAC Level of consciousness: awake and alert and oriented Pain management: satisfactory to patient Vital Signs Assessment: post-procedure vital signs reviewed and stable Respiratory status: spontaneous breathing, nonlabored ventilation and respiratory function stable Cardiovascular status: blood pressure returned to baseline and stable Postop Assessment: Adequate PO intake and No signs of nausea or vomiting Anesthetic complications: no    Raliegh Ip

## 2016-09-03 NOTE — Transfer of Care (Signed)
Immediate Anesthesia Transfer of Care Note  Patient: Karen Anthony  Procedure(s) Performed: Procedure(s) with comments: COLONOSCOPY WITH PROPOFOL (N/A) ESOPHAGOGASTRODUODENOSCOPY (EGD) WITH PROPOFOL (N/A) - Diabetic - oral meds  Patient Location: PACU  Anesthesia Type: MAC  Level of Consciousness: awake, alert  and patient cooperative  Airway and Oxygen Therapy: Patient Spontanous Breathing and Patient connected to supplemental oxygen  Post-op Assessment: Post-op Vital signs reviewed, Patient's Cardiovascular Status Stable, Respiratory Function Stable, Patent Airway and No signs of Nausea or vomiting  Post-op Vital Signs: Reviewed and stable  Complications: No apparent anesthesia complications

## 2016-09-03 NOTE — Op Note (Signed)
[facility] Gastroenterology Patient Name: Karen Anthony Procedure Date: 09/03/2016 9:49 AM MRN: [id number] Account #: [id number] Date of Birth: 09-15-1961 Admit Type: Outpatient Age: 55 Room: [facility] OR ROOM 01 Gender: Female Note Status: Finalized Procedure:            Colonoscopy Indications:          Screening for colorectal malignant neoplasm Providers:            Lucilla Lame MD, MD Referring MD:         Venetia Maxon. Elijio Miles, MD (Referring MD) Medicines:            Propofol per Anesthesia Complications:        No immediate complications. Procedure:            Pre-Anesthesia Assessment:                       - Prior to the procedure, a History and Physical was                        performed, and patient medications and allergies were                        reviewed. The patient's tolerance of previous                        anesthesia was also reviewed. The risks and benefits of                        the procedure and the sedation options and risks were                        discussed with the patient. All questions were                        answered, and informed consent was obtained. Prior                        Anticoagulants: The patient has taken no previous                        anticoagulant or antiplatelet agents. ASA Grade                        Assessment: II - A patient with mild systemic disease.                        After reviewing the risks and benefits, the patient was                        deemed in satisfactory condition to undergo the                        procedure.                       After obtaining informed consent, the colonoscope was                        passed under direct vision. Throughout the procedure,  the patient's blood pressure, pulse, and oxygen                        saturations were monitored continuously. The Olympus CF                        H180AL colonoscope (S#: [id number]) was introduced  through                        the anus and advanced to the the cecum, identified by                        appendiceal orifice and ileocecal valve. The                        colonoscopy was performed without difficulty. The                        patient tolerated the procedure well. The quality of                        the bowel preparation was excellent. Findings:      The perianal and digital rectal examinations were normal.      A 5 mm polyp was found in the rectum. The polyp was sessile. The polyp       was removed with a cold snare. Resection and retrieval were complete. Impression:           - One 5 mm polyp in the rectum, removed with a cold                        snare. Resected and retrieved. Recommendation:       - Discharge patient to home.                       - No repeat colonoscopy.                       - Repeat colonoscopy in 5 years if polyp adenoma and 10                        years if hyperplastic                       - Resume previous diet. Procedure Code(s):    --- Professional ---                       [phone], Colonoscopy, flexible; with removal of tumor(s),                        polyp(s), or other lesion(s) by snare technique Diagnosis Code(s):    --- Professional ---                       Z12.11, Encounter for screening for malignant neoplasm                        of colon                       K62.1, Rectal polyp CPT copyright 2016 American  Medical Association. All rights reserved. The codes documented in this report are preliminary and upon coder review may  be revised to meet current compliance requirements. Lucilla Lame MD, MD 09/03/2016 10:05:57 AM This report has been signed electronically. Number of Addenda: 0 Note Initiated On: 09/03/2016 9:49 AM Scope Withdrawal Time: 0 hours 8 minutes 2 seconds  Total Procedure Duration: 0 hours 13 minutes 39 seconds       [facility]

## 2016-09-04 ENCOUNTER — Encounter: Payer: Self-pay | Admitting: Obstetrics and Gynecology

## 2016-09-05 ENCOUNTER — Encounter: Payer: Self-pay | Admitting: Gastroenterology

## 2016-09-06 ENCOUNTER — Encounter: Payer: Self-pay | Admitting: Gastroenterology

## 2017-10-17 ENCOUNTER — Ambulatory Visit
Admission: EM | Admit: 2017-10-17 | Discharge: 2017-10-17 | Disposition: A | Discharge status: Home / Self Care | Payer: Self-pay | Attending: Family Medicine | Admitting: Family Medicine

## 2017-10-17 ENCOUNTER — Ambulatory Visit (INDEPENDENT_AMBULATORY_CARE_PROVIDER_SITE_OTHER): Payer: Self-pay

## 2017-10-17 ENCOUNTER — Encounter: Payer: Self-pay | Admitting: Emergency Medicine

## 2017-10-17 DIAGNOSIS — I10 Essential (primary) hypertension: Secondary | ICD-10-CM | POA: Insufficient documentation

## 2017-10-17 DIAGNOSIS — Z791 Long term (current) use of non-steroidal anti-inflammatories (NSAID): Secondary | ICD-10-CM | POA: Insufficient documentation

## 2017-10-17 DIAGNOSIS — K621 Rectal polyp: Secondary | ICD-10-CM | POA: Insufficient documentation

## 2017-10-17 DIAGNOSIS — Z7984 Long term (current) use of oral hypoglycemic drugs: Secondary | ICD-10-CM | POA: Insufficient documentation

## 2017-10-17 DIAGNOSIS — Z800 Family history of malignant neoplasm of digestive organs: Secondary | ICD-10-CM | POA: Insufficient documentation

## 2017-10-17 DIAGNOSIS — M1991 Primary osteoarthritis, unspecified site: Secondary | ICD-10-CM | POA: Insufficient documentation

## 2017-10-17 DIAGNOSIS — Z9889 Other specified postprocedural states: Secondary | ICD-10-CM | POA: Insufficient documentation

## 2017-10-17 DIAGNOSIS — M25531 Pain in right wrist: Secondary | ICD-10-CM

## 2017-10-17 DIAGNOSIS — K5909 Other constipation: Secondary | ICD-10-CM | POA: Insufficient documentation

## 2017-10-17 DIAGNOSIS — M79641 Pain in right hand: Secondary | ICD-10-CM | POA: Insufficient documentation

## 2017-10-17 DIAGNOSIS — E119 Type 2 diabetes mellitus without complications: Secondary | ICD-10-CM | POA: Insufficient documentation

## 2017-10-17 DIAGNOSIS — Z833 Family history of diabetes mellitus: Secondary | ICD-10-CM | POA: Insufficient documentation

## 2017-10-17 DIAGNOSIS — Z79899 Other long term (current) drug therapy: Secondary | ICD-10-CM | POA: Insufficient documentation

## 2017-10-17 DIAGNOSIS — R1013 Epigastric pain: Secondary | ICD-10-CM | POA: Insufficient documentation

## 2017-10-17 DIAGNOSIS — Z8249 Family history of ischemic heart disease and other diseases of the circulatory system: Secondary | ICD-10-CM | POA: Insufficient documentation

## 2017-10-17 DIAGNOSIS — M5412 Radiculopathy, cervical region: Secondary | ICD-10-CM | POA: Insufficient documentation

## 2017-10-17 LAB — GLUCOSE, CAPILLARY: Glucose-Capillary: 146 mg/dL — ABNORMAL HIGH (ref 65–99)

## 2017-10-17 MED ORDER — TIZANIDINE HCL 4 MG PO CAPS: 4.0000 mg | ORAL_CAPSULE | Freq: Three times a day (TID) | ORAL | 0 refills | Status: DC

## 2017-10-17 MED ORDER — MELOXICAM 15 MG PO TABS: 15.0000 mg | ORAL_TABLET | Freq: Every day | ORAL | 0 refills | Status: DC

## 2017-10-17 NOTE — ED Provider Notes (Signed)
[facility]    CSN: [id number] Arrival date & time: 10/17/17  1150     History   Chief Complaint Chief Complaint  Patient presents with  . Hand Pain    HPI Karen Anthony is a 56 y.o. female.   HPI  56 year old female who presents with right dominant hand pain that she's experienced for 2 months. She states that she remembers vividly falling asleep on a couch with her head resting on an armrest. She said that way all night and then when she awoke in the morning she was experiencing his pain she states is at her wrist and involves all of her fingers more the little ring finger less of the index middle and thumb. Is that the pain will radiate all the way up to her elbow level. He does have some decreased range of motion of her cervical spine with pain when she turns to the right or has lateral flexion to the right. There is a during the daytime the pain is not as bad but his minute that she lies down to sleep the hand pain intensifies and will keep her awake for long periods of time. He complains more burning. She has no other injury that she had sustained. She has noticed a decreased range of motion of flexion. She has no left-sided symptoms.        Past Medical History:  Diagnosis Date  . Anemia    Hx  . Arthritis    knees, hands  . Diabetes mellitus without complication ([place])   . Hypertension   . Palpitations   . Sinus congestion   . Uterine fibroid   . Wears dentures    full upper    Patient Active Problem List   Diagnosis Date Noted  . Special screening for malignant neoplasms, colon   . Rectal polyp   . Fibroid, uterine 07/30/2016  . Type 2 diabetes mellitus ([place]) 06/22/2016  . Epigastric pain 06/11/2016  . Chronic idiopathic constipation 06/11/2016  . Overweight (BMI 25.0-29.9) 06/11/2016  . Primary osteoarthritis of both knees 01/10/2015    Past Surgical History:  Procedure Laterality Date  . APPENDECTOMY    . CESAREAN SECTION     3 times  . COLONOSCOPY WITH PROPOFOL N/A 09/03/2016   Procedure: COLONOSCOPY WITH PROPOFOL;  Surgeon: Lucilla Lame, MD;  Location: [place];  Service: Endoscopy;  Laterality: N/A;  . ESOPHAGOGASTRODUODENOSCOPY (EGD) WITH PROPOFOL N/A 09/03/2016   Procedure: ESOPHAGOGASTRODUODENOSCOPY (EGD) WITH PROPOFOL;  Surgeon: Lucilla Lame, MD;  Location: [place];  Service: Endoscopy;  Laterality: N/A;  Diabetic - oral meds  . UTERINE FIBROID SURGERY      OB History    No data available       Home Medications    Prior to Admission medications   Medication Sig Start Date End Date Taking? Authorizing Provider  lisinopril (PRINIVIL,ZESTRIL) 20 MG tablet Take 20 mg by mouth every morning.  01/25/15   [provider]  meloxicam (MOBIC) 15 MG tablet Take 1 tablet (15 mg total) by mouth daily. 10/17/17   Lorin Picket, PA-C  metFORMIN (GLUCOPHAGE) 500 MG tablet Take 500 mg by mouth 2 (two) times daily with a meal.    [provider]  tiZANidine (ZANAFLEX) 4 MG capsule Take 1 capsule (4 mg total) by mouth 3 (three) times daily. As necessary for muscle spasm 10/17/17   Lorin Picket, PA-C    Family History Family History  Problem Relation Age of Onset  .  Diabetes Mother   . Hypertension Mother   . Diabetes Father   . Hypertension Father   . Colon cancer Sister     Social History Social History  Substance Use Topics  . Smoking status: Never Smoker  . Smokeless tobacco: Never Used  . Alcohol use No     Allergies   Patient has no known allergies.   Review of Systems Review of Systems  Constitutional: Positive for activity change. Negative for appetite change, chills, fatigue and fever.  Musculoskeletal: Positive for myalgias, neck pain and neck stiffness.  All other systems reviewed and are negative.    Physical Exam Triage Vital Signs ED Triage Vitals  Enc Vitals Group     BP 10/17/17 1200 (!) 160/77     Pulse Rate 10/17/17 1200 94      Resp 10/17/17 1200 16     Temp 10/17/17 1200 98.6 F (37 C)     Temp Source 10/17/17 1200 Oral     SpO2 10/17/17 1200 100 %     Weight 10/17/17 1159 230 lb 9.6 oz (104.6 kg)     Height 10/17/17 1159 5\' 11"  (1.803 m)     Head Circumference --      Peak Flow --      Pain Score 10/17/17 1159 9     Pain Loc --      Pain Edu? --      Excl. in [place]? --    No data found.   Updated Vital Signs BP (!) 160/77 (BP Location: Left Arm)   Pulse 94   Temp 98.6 F (37 C) (Oral)   Resp 16   Ht 5\' 11"  (1.803 m)   Wt 230 lb 9.6 oz (104.6 kg)   SpO2 100%   BMI 32.16 kg/m   Visual Acuity Right Eye Distance:   Left Eye Distance:   Bilateral Distance:    Right Eye Near:   Left Eye Near:    Bilateral Near:     Physical Exam  Constitutional: She is oriented to person, place, and time. She appears well-developed and well-nourished. No distress.  HENT:  Head: Normocephalic.  Eyes: Pupils are equal, round, and reactive to light.  Neck:  Examination of the cervical spine shows some mild limitation of motion with discomfort at extremes of rightward rotation and right lateral flexion and extension. Some tenderness over the trapezial muscle on the right.  elbow range of motion is normal. Wrist extension lacks approximately 10 of full motion in comparison to the left. Finger range of motion is full in extension. Passively she is able to flex her fingers into her palm without difficulty however she has less flexion approximately 15 actively. Is a negative Tinel's at the wrist and also at the elbow. Phalen's test on the right does produce pain and numbness in the ring and little fingers.'s good extension of her fingers and wrist. Flexors are all intact. She has no hip esthesia to light touch.  Musculoskeletal: She exhibits tenderness.  Lymphadenopathy:    She has no cervical adenopathy.  Neurological: She is alert and oriented to person, place, and time.  Skin: Skin is warm and dry. She is not  diaphoretic.  Psychiatric: She has a normal mood and affect. Her behavior is normal. Judgment and thought content normal.  Nursing note and vitals reviewed.    UC Treatments / Results  Labs (all labs ordered are listed, but only abnormal results are displayed) Labs Reviewed  GLUCOSE, CAPILLARY - Abnormal; Notable  for the following:       Result Value   Glucose-Capillary 146 (*)    All other components within normal limits  CBG MONITORING, ED    EKG  EKG Interpretation None       Radiology Dg Wrist Complete Right  Result Date: 10/17/2017 CLINICAL DATA:  RIGHT wrist pain and burning for 2 months from forearm and to hand, greatest at distal forearm and wrist EXAM: RIGHT WRIST - COMPLETE 3+ VIEW COMPARISON:  None FINDINGS: Soft tissue swelling at wrist. Osseous mineralization normal. Joint spaces preserved. No fracture, dislocation, or bone destruction. IMPRESSION: No acute osseous abnormalities. Electronically Signed   By: Lavonia Dana M.D.   On: 10/17/2017 13:19    Procedures Procedures (including critical care time)  Medications Ordered in UC Medications - No data to display   Initial Impression / Assessment and Plan / UC Course  I have reviewed the triage vital signs and the nursing notes.  Pertinent labs & imaging results that were available during my care of the patient were reviewed by me and considered in my medical decision making (see chart for details).     Plan: 1. Test/x-ray results and diagnosis reviewed with patient 2. rx as per orders; risks, benefits, potential side effects reviewed with patient 3. Recommend supportive treatment with symptom avoidance. Stop taking Naprosyn and instead switched and Mobic. Use wrist splint at nighttime because of the wrist and hand pain. Is most likely emanating from the cervical spine. Recommend following up with the orthopedic surgery [facility] if you do not improve. 4. F/u prn if symptoms worsen or don't improve   Final  Clinical Impressions(s) / UC Diagnoses   Final diagnoses:  Cervical radiculitis    New Prescriptions Discharge Medication List as of 10/17/2017  1:33 PM    START taking these medications   Details  meloxicam (MOBIC) 15 MG tablet Take 1 tablet (15 mg total) by mouth daily., Starting Thu 10/17/2017, Normal    tiZANidine (ZANAFLEX) 4 MG capsule Take 1 capsule (4 mg total) by mouth 3 (three) times daily. As necessary for muscle spasm, Starting Thu 10/17/2017, Normal         Controlled Substance Prescriptions [place] Controlled Substance Registry consulted? Not Applicable   Lorin Picket, PA-C 10/17/17 1346

## 2017-10-17 NOTE — ED Triage Notes (Signed)
Patient c/o right hand pain for the past 2 months.  Patient denies injury.  Patient states that she woke up and started hurting.

## 2017-10-17 NOTE — Discharge Instructions (Signed)
If you not improving recommend follow-up at [facility] orthopedic surgery/hand surgery. Stop taking Naprosyn and instead take Mobic.

## 2017-10-23 ENCOUNTER — Ambulatory Visit
Admission: EM | Admit: 2017-10-23 | Discharge: 2017-10-23 | Disposition: A | Discharge status: Home / Self Care | Payer: Self-pay | Attending: Family Medicine | Admitting: Family Medicine

## 2017-10-23 ENCOUNTER — Encounter: Payer: Self-pay | Admitting: *Deleted

## 2017-10-23 DIAGNOSIS — M5412 Radiculopathy, cervical region: Secondary | ICD-10-CM

## 2017-10-23 MED ORDER — MELOXICAM 7.5 MG PO TABS: 7.5000 mg | ORAL_TABLET | Freq: Two times a day (BID) | ORAL | 0 refills | Status: DC

## 2017-10-23 NOTE — ED Provider Notes (Signed)
[facility]    CSN: [id number] Arrival date & time: 10/23/17  1020     History   Chief Complaint Chief Complaint  Patient presents with  . Arm Pain    HPI Karen Anthony is a 56 y.o. female.   Patient is a 56 year female who presents complaining of progressive pain to the left arm and hand. Patient states that the pain started after sleeping on her daughter's couch a couple months ago. She was seen here last week and given prescription for meloxicam and Zanaflex. Patient reports the pain is not improved. She also reports that her diffuse arthritis is worse with the meloxicam instead of her normal naproxen. Patient states she has a follow-up with the recommended specialist that she does not have medical insurance and is worried about cost. Patient reports that the pain is worse when she is laying down, especially on her right side. However it is much better if she is upright.       Past Medical History:  Diagnosis Date  . Anemia    Hx  . Arthritis    knees, hands  . Diabetes mellitus without complication ([place])   . Hypertension   . Palpitations   . Sinus congestion   . Uterine fibroid   . Wears dentures    full upper    Patient Active Problem List   Diagnosis Date Noted  . Special screening for malignant neoplasms, colon   . Rectal polyp   . Fibroid, uterine 07/30/2016  . Type 2 diabetes mellitus ([place]) 06/22/2016  . Epigastric pain 06/11/2016  . Chronic idiopathic constipation 06/11/2016  . Overweight (BMI 25.0-29.9) 06/11/2016  . Primary osteoarthritis of both knees 01/10/2015    Past Surgical History:  Procedure Laterality Date  . APPENDECTOMY    . CESAREAN SECTION     3 times  . COLONOSCOPY WITH PROPOFOL N/A 09/03/2016   Procedure: COLONOSCOPY WITH PROPOFOL;  Surgeon: Lucilla Lame, MD;  Location: [place];  Service: Endoscopy;  Laterality: N/A;  . ESOPHAGOGASTRODUODENOSCOPY (EGD) WITH PROPOFOL N/A 09/03/2016   Procedure:  ESOPHAGOGASTRODUODENOSCOPY (EGD) WITH PROPOFOL;  Surgeon: Lucilla Lame, MD;  Location: [place];  Service: Endoscopy;  Laterality: N/A;  Diabetic - oral meds  . UTERINE FIBROID SURGERY      OB History    No data available       Home Medications    Prior to Admission medications   Medication Sig Start Date End Date Taking? Authorizing Provider  lisinopril (PRINIVIL,ZESTRIL) 20 MG tablet Take 20 mg by mouth every morning.  01/25/15  Yes [provider]  metFORMIN (GLUCOPHAGE) 500 MG tablet Take 500 mg by mouth 2 (two) times daily with a meal.   Yes [provider]  tiZANidine (ZANAFLEX) 4 MG capsule Take 1 capsule (4 mg total) by mouth 3 (three) times daily. As necessary for muscle spasm 10/17/17  Yes Lorin Picket, PA-C  meloxicam (MOBIC) 7.5 MG tablet Take 1 tablet (7.5 mg total) by mouth 2 (two) times daily. 10/23/17   Luvenia Redden, PA-C    Family History Family History  Problem Relation Age of Onset  . Diabetes Mother   . Hypertension Mother   . Diabetes Father   . Hypertension Father   . Colon cancer Sister     Social History Social History  Substance Use Topics  . Smoking status: Never Smoker  . Smokeless tobacco: Never Used  . Alcohol use No     Allergies  Patient has no known allergies.   Review of Systems Review of Systems  As noted above in history of present illness. Other system reviewed and found to be negative.   Physical Exam Triage Vital Signs ED Triage Vitals  Enc Vitals Group     BP 10/23/17 1031 (!) 173/74     Pulse Rate 10/23/17 1031 85     Resp 10/23/17 1031 14     Temp 10/23/17 1031 98.4 F (36.9 C)     Temp Source 10/23/17 1031 Oral     SpO2 10/23/17 1031 100 %     Weight 10/23/17 1033 230 lb (104.3 kg)     Height 10/23/17 1033 5\' 11"  (1.803 m)     Head Circumference --      Peak Flow --      Pain Score 10/23/17 1033 10     Pain Loc --      Pain Edu? --      Excl. in [place]? --    No data  found.   Updated Vital Signs BP (!) 173/74 (BP Location: Left Arm)   Pulse 85   Temp 98.4 F (36.9 C) (Oral)   Resp 14   Ht 5\' 11"  (1.803 m)   Wt 230 lb (104.3 kg)   SpO2 100%   BMI 32.08 kg/m    Physical Exam  Constitutional: She appears well-nourished. No distress.  HENT:  Head: Normocephalic and atraumatic.  Eyes: Pupils are equal, round, and reactive to light. EOM are normal.  Pulmonary/Chest: Effort normal.  Musculoskeletal: She exhibits no edema or deformity.       Right hand: She exhibits tenderness. She exhibits normal range of motion. Decreased strength noted.  Patient unable to fully closed fist on her right hand. Positive Tinel's and Phalen's. Rings on the right. Some increased pain with neck flexion and rotation.      UC Treatments / Results  Labs (all labs ordered are listed, but only abnormal results are displayed) Labs Reviewed - No data to display  EKG  EKG Interpretation None       Radiology No results found.  Procedures Procedures (including critical care time)  Medications Ordered in UC Medications - No data to display   Initial Impression / Assessment and Plan / UC Course  I have reviewed the triage vital signs and the nursing notes.  Pertinent labs & imaging results that were available during my care of the patient were reviewed by me and considered in my medical decision making (see chart for details).     Patient presents with similar progressive symptoms as prior visit with likely cervical radiculopathy.  Final Clinical Impressions(s) / UC Diagnoses   Final diagnoses:  Cervical radiculopathy   Patient with concerns regarding cost she does not have insurance. Patient given contact information for the [facility] clinics and then also advised that [facility] as well. Will change her Mobic from once a day to twice a day at a smaller dose.  New Prescriptions New Prescriptions   MELOXICAM (MOBIC) 7.5 MG  TABLET    Take 1 tablet (7.5 mg total) by mouth 2 (two) times daily.     Controlled Substance Prescriptions [place] Controlled Substance Registry consulted? Not Applicable   Luvenia Redden, PA-C 10/23/17 1110

## 2017-10-23 NOTE — Discharge Instructions (Signed)
-  stop taking the meloxicam 15 mg once a day and start 7.5mg  twice a day -attached information for [facility], can call to see which clinic can get you in sooner. Will need follow up. Could try physical therapy or an orthopedics consult. -[facility] in [place] is also an urgent care facility and takes walk ins as well. -can try over the counter melatonin or diphenhydramine for sleep.

## 2017-10-23 NOTE — ED Triage Notes (Signed)
C/O pain to right arm started two months ago. Pt was seen here last week and was given medication for it, but she states medicine is not working

## 2017-11-07 ENCOUNTER — Emergency Department: Payer: Self-pay

## 2017-11-07 ENCOUNTER — Emergency Department
Admission: EM | Admit: 2017-11-07 | Discharge: 2017-11-07 | Disposition: A | Discharge status: Home / Self Care | Payer: Self-pay | Attending: Student in an Organized Health Care Education/Training Program | Admitting: Student in an Organized Health Care Education/Training Program

## 2017-11-07 ENCOUNTER — Other Ambulatory Visit: Payer: Self-pay

## 2017-11-07 DIAGNOSIS — G5603 Carpal tunnel syndrome, bilateral upper limbs: Secondary | ICD-10-CM | POA: Insufficient documentation

## 2017-11-07 DIAGNOSIS — I10 Essential (primary) hypertension: Secondary | ICD-10-CM | POA: Insufficient documentation

## 2017-11-07 DIAGNOSIS — E119 Type 2 diabetes mellitus without complications: Secondary | ICD-10-CM | POA: Insufficient documentation

## 2017-11-07 DIAGNOSIS — Z7984 Long term (current) use of oral hypoglycemic drugs: Secondary | ICD-10-CM | POA: Insufficient documentation

## 2017-11-07 DIAGNOSIS — M79671 Pain in right foot: Secondary | ICD-10-CM | POA: Insufficient documentation

## 2017-11-07 DIAGNOSIS — R200 Anesthesia of skin: Secondary | ICD-10-CM | POA: Insufficient documentation

## 2017-11-07 DIAGNOSIS — Z79899 Other long term (current) drug therapy: Secondary | ICD-10-CM | POA: Insufficient documentation

## 2017-11-07 LAB — URINALYSIS, COMPLETE (UACMP) WITH MICROSCOPIC
Bacteria, UA: NONE SEEN
Bilirubin Urine: NEGATIVE
Glucose, UA: NEGATIVE mg/dL
Hgb urine dipstick: NEGATIVE
Ketones, ur: NEGATIVE mg/dL
Leukocytes, UA: NEGATIVE
Nitrite: NEGATIVE
Protein, ur: NEGATIVE mg/dL
Specific Gravity, Urine: 1.02 (ref 1.005–1.030)
pH: 6 (ref 5.0–8.0)

## 2017-11-07 LAB — CBC
HCT: 35.2 % (ref 35.0–47.0)
Hemoglobin: 11.7 g/dL — ABNORMAL LOW (ref 12.0–16.0)
MCH: 30.6 pg (ref 26.0–34.0)
MCHC: 33.3 g/dL (ref 32.0–36.0)
MCV: 92.1 fL (ref 80.0–100.0)
Platelets: 223 10*3/uL (ref 150–440)
RBC: 3.82 MIL/uL (ref 3.80–5.20)
RDW: 12.8 % (ref 11.5–14.5)
WBC: 5.5 10*3/uL (ref 3.6–11.0)

## 2017-11-07 LAB — BASIC METABOLIC PANEL
Anion gap: 9 (ref 5–15)
BUN: 13 mg/dL (ref 6–20)
CO2: 27 mmol/L (ref 22–32)
Calcium: 9.3 mg/dL (ref 8.9–10.3)
Chloride: 103 mmol/L (ref 101–111)
Creatinine, Ser: 0.6 mg/dL (ref 0.44–1.00)
GFR calc Af Amer: >60 mL/min (ref >60)
GFR calc non Af Amer: >60 mL/min (ref >60)
Glucose, Bld: 160 mg/dL — ABNORMAL HIGH (ref 65–99)
Potassium: 3.6 mmol/L (ref 3.5–5.1)
Sodium: 139 mmol/L (ref 135–145)

## 2017-11-07 LAB — GLUCOSE, CAPILLARY: Glucose-Capillary: 158 mg/dL — ABNORMAL HIGH (ref 65–99)

## 2017-11-07 MED ORDER — AMOXICILLIN-POT CLAVULANATE 875-125 MG PO TABS: 1.0000 | ORAL_TABLET | Freq: Two times a day (BID) | ORAL | 0 refills | Status: AC

## 2017-11-07 MED ORDER — TRAMADOL HCL 50 MG PO TABS: 50.0000 mg | ORAL_TABLET | Freq: Four times a day (QID) | ORAL | 0 refills | Status: AC | PRN

## 2017-11-07 NOTE — ED Provider Notes (Signed)
[facility] Emergency Department Provider Note    None    (approximate)  I have reviewed the triage vital signs and the nursing notes.   HISTORY  Chief Complaint Numbness and Skin Discoloration (of right 3 toes)    HPI Karen Anthony is a 56 y.o. female Karen Anthony with chief complaint of swelling and discoloration in her right foot she noted in the shower the other day.  Patient also complaining of several weeks of progressively worsening initially right arm pain now in her left.  Patient recently seen at [facility] and sent home with anti-inflammatories.  Denies any trauma.  States that pain is worse with flexion and describes it as a burning electricity-like pain in her hands.  Denies any swelling.  No numbness or tingling.  Is worse in the morning when she wakes from sleep.  Denies any chest pain or shortness of breath.  No abdominal pain.  Does have a history of hypertension as well as diabetes and states that she has been compliant with her medications.  Past Medical History:  Diagnosis Date  . Anemia    Hx  . Arthritis    knees, hands  . Diabetes mellitus without complication ([place])   . Hypertension   . Palpitations   . Sinus congestion   . Uterine fibroid   . Wears dentures    full upper   Family History  Problem Relation Age of Onset  . Diabetes Mother   . Hypertension Mother   . Diabetes Father   . Hypertension Father   . Colon cancer Sister    Past Surgical History:  Procedure Laterality Date  . APPENDECTOMY    . CESAREAN SECTION     3 times  . COLONOSCOPY WITH PROPOFOL N/A 09/03/2016   Procedure: COLONOSCOPY WITH PROPOFOL;  Surgeon: Lucilla Lame, MD;  Location: [place];  Service: Endoscopy;  Laterality: N/A;  . ESOPHAGOGASTRODUODENOSCOPY (EGD) WITH PROPOFOL N/A 09/03/2016   Procedure: ESOPHAGOGASTRODUODENOSCOPY (EGD) WITH PROPOFOL;  Surgeon: Lucilla Lame, MD;  Location: [place];  Service: Endoscopy;   Laterality: N/A;  Diabetic - oral meds  . UTERINE FIBROID SURGERY     Patient Active Problem List   Diagnosis Date Noted  . Special screening for malignant neoplasms, colon   . Rectal polyp   . Fibroid, uterine 07/30/2016  . Type 2 diabetes mellitus ([place]) 06/22/2016  . Epigastric pain 06/11/2016  . Chronic idiopathic constipation 06/11/2016  . Overweight (BMI 25.0-29.9) 06/11/2016  . Primary osteoarthritis of both knees 01/10/2015      Prior to Admission medications   Medication Sig Start Date End Date Taking? Authorizing Provider  amoxicillin-clavulanate (AUGMENTIN) 875-125 MG tablet Take 1 tablet every 12 (twelve) hours for 7 days by mouth. 11/07/17 11/14/17  Merlyn Lot, MD  lisinopril (PRINIVIL,ZESTRIL) 20 MG tablet Take 20 mg by mouth every morning.  01/25/15   [provider]  meloxicam (MOBIC) 7.5 MG tablet Take 1 tablet (7.5 mg total) by mouth 2 (two) times daily. 10/23/17   Luvenia Redden, PA-C  metFORMIN (GLUCOPHAGE) 500 MG tablet Take 500 mg by mouth 2 (two) times daily with a meal.    [provider]  tiZANidine (ZANAFLEX) 4 MG capsule Take 1 capsule (4 mg total) by mouth 3 (three) times daily. As necessary for muscle spasm 10/17/17   Crecencio Mc P, PA-C  traMADol (ULTRAM) 50 MG tablet Take 1 tablet (50 mg total) every 6 (six) hours as needed by mouth.  11/07/17 11/07/18  Merlyn Lot, MD    Allergies Patient has no known allergies.    Social History Social History   Tobacco Use  . Smoking status: Never Smoker  . Smokeless tobacco: Never Used  Substance Use Topics  . Alcohol use: No  . Drug use: No    Review of Systems Patient denies headaches, rhinorrhea, blurry vision, numbness, shortness of breath, chest pain, edema, cough, abdominal pain, nausea, vomiting, diarrhea, dysuria, fevers, rashes or hallucinations unless otherwise stated above in HPI. ____________________________________________   PHYSICAL EXAM:  VITAL  SIGNS: Vitals:   11/07/17 1515 11/07/17 1649  BP: (!) 160/75 (!) 162/81  Pulse: 80 79  Resp:  18  Temp:    SpO2: 98% 96%    Constitutional: Alert and oriented. Well appearing and in no acute distress. Eyes: Conjunctivae are normal.  Head: Atraumatic. Nose: No congestion/rhinnorhea. Mouth/Throat: Mucous membranes are moist.   Neck: No stridor. Painless ROM.  Cardiovascular: Normal rate, regular rhythm. Grossly normal heart sounds.  Good peripheral circulation. Respiratory: Normal respiratory effort.  No retractions. Lungs CTAB. Gastrointestinal: Soft and nontender. No distention. No abdominal bruits. No CVA tenderness. Genitourinary:  Musculoskeletal: She has positive Phalen's as well as Tinel's sign right greater than left consistent with carpal tunnel.  She has good distal perfusion.  Motor exam is intact in radial, ulnar and median nerve distributions bilateral upper extremities.  There is no swelling or erythema.  No effusions.  No pain with palpation of the neck or change of sensation with full range of motion of the neck.  Right foot does have small amount of edema over the second through third digits without any overlying cellulitic changes or purulence.  She has palpable PT and DP pulses bilaterally Neurologic:  Normal speech and language. No gross focal neurologic deficits are appreciated. No facial droop Skin:  Skin is warm, dry and intact. No rash noted. Psychiatric: Mood and affect are normal. Speech and behavior are normal.  ____________________________________________   LABS (all labs ordered are listed, but only abnormal results are displayed)  Results for orders placed or performed during the hospital encounter of 11/07/17 (from the past 24 hour(s))  Basic metabolic panel     Status: Abnormal   Collection Time: 11/07/17  1:12 PM  Result Value Ref Range   Sodium 139 135 - 145 mmol/L   Potassium 3.6 3.5 - 5.1 mmol/L   Chloride 103 101 - 111 mmol/L   CO2 27 22 - 32  mmol/L   Glucose, Bld 160 (H) 65 - 99 mg/dL   BUN 13 6 - 20 mg/dL   Creatinine, Ser 0.60 0.44 - 1.00 mg/dL   Calcium 9.3 8.9 - 10.3 mg/dL   GFR calc non Af Amer >60 >60 mL/min   GFR calc Af Amer >60 >60 mL/min   Anion gap 9 5 - 15  CBC     Status: Abnormal   Collection Time: 11/07/17  1:12 PM  Result Value Ref Range   WBC 5.5 3.6 - 11.0 K/uL   RBC 3.82 3.80 - 5.20 MIL/uL   Hemoglobin 11.7 (L) 12.0 - 16.0 g/dL   HCT 35.2 35.0 - 47.0 %   MCV 92.1 80.0 - 100.0 fL   MCH 30.6 26.0 - 34.0 pg   MCHC 33.3 32.0 - 36.0 g/dL   RDW 12.8 11.5 - 14.5 %   Platelets 223 150 - 440 K/uL  Urinalysis, Complete w Microscopic     Status: Abnormal   Collection Time: 11/07/17  1:13 PM  Result Value Ref Range   Color, Urine YELLOW (A) YELLOW   APPearance HAZY (A) CLEAR   Specific Gravity, Urine 1.020 1.005 - 1.030   pH 6.0 5.0 - 8.0   Glucose, UA NEGATIVE NEGATIVE mg/dL   Hgb urine dipstick NEGATIVE NEGATIVE   Bilirubin Urine NEGATIVE NEGATIVE   Ketones, ur NEGATIVE NEGATIVE mg/dL   Protein, ur NEGATIVE NEGATIVE mg/dL   Nitrite NEGATIVE NEGATIVE   Leukocytes, UA NEGATIVE NEGATIVE   RBC / HPF 0-5 0 - 5 RBC/hpf   WBC, UA 0-5 0 - 5 WBC/hpf   Bacteria, UA NONE SEEN NONE SEEN   Squamous Epithelial / LPF 6-30 (A) NONE SEEN   Mucus PRESENT   Glucose, capillary     Status: Abnormal   Collection Time: 11/07/17  1:25 PM  Result Value Ref Range   Glucose-Capillary 158 (H) 65 - 99 mg/dL   ____________________________________________ _______________________________  RADIOLOGY  I personally reviewed all radiographic images ordered to evaluate for the above acute complaints and reviewed radiology reports and findings.  These findings were personally discussed with the patient.  Please see medical record for radiology report.  ____________________________________________   PROCEDURES  Procedure(s) performed:  Procedures    Critical Care performed:  no ____________________________________________   INITIAL IMPRESSION / ASSESSMENT AND PLAN / ED COURSE  Pertinent labs & imaging results that were available during my care of the patient were reviewed by me and considered in my medical decision making (see chart for details).  DDX: VT, carpal tunnel, radiculopathy, cellulitis, limb ischemia, claudication, ACS or dissection  Karen Anthony is a 56 y.o. who presents to the ED with above complaints.  She is afebrile, mildly hypertensive but well-appearing and ambulated in the room in no distress.  Symptoms seem to be subacute in nature.  Regarding her pain in her upper extremities is clinically most consistent with carpal tunnel.  She has no physical exam findings suggest significant radiculopathy.  Discussed option for MRI to further characterize as this has been going on for a while and is causing her some discomfort but the patient is declined this.  Based on the duration of her symptoms I do believe that is reasonable to have her follow-up in outpatient clinic for further workup.  Regarding the right leg pain may have a component of cellulitis that she is a diabetic but she is well perfused and there is no evidence of critical limb ischemia.  May have a component of claudication.  Will order ultrasound to evaluate for DVT.   ----------------------------------------- 4:55 PM on 11/07/2017 ----------------------------------------- There is no evidence of DVT.  Based on the small area of swelling tenderness and edema with her underlying diabetes will treat with antibiotics for possible component of cellulitis the patient has no fever or white count.  Patient will also be provided with Ultram for her pain.  I discussed follow-up with outpatient clinics.  Patient demonstrates understanding and agreed with plan.    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 Use AVSMEDLIST instead to display the medication list for a patient.   Note:  This document was prepared using Dragon voice recognition software and may include unintentional dictation errors.    Merlyn Lot, MD 11/07/17 [id number]

## 2017-11-07 NOTE — ED Notes (Addendum)
Pt states that right arm became numb and now she feel her left side is also becoming numb. Pt's toes on right foot are swollen, numb, and has discoloration at the toes. Started 2 days ago. Pt denies neck pain. Dr. Quentin Cornwall at bedside.

## 2017-11-07 NOTE — ED Triage Notes (Signed)
Pt c/o numbness to BL hands for the past month . Pt also c/o 3 toes on the right foot turning black over the past 2 days. States she is a diabetic and has been out of all her meds.

## 2018-02-17 ENCOUNTER — Other Ambulatory Visit: Payer: Self-pay | Admitting: Internal Medicine

## 2018-02-17 DIAGNOSIS — Z1231 Encounter for screening mammogram for malignant neoplasm of breast: Secondary | ICD-10-CM

## 2018-12-25 IMAGING — CR DG WRIST COMPLETE 3+V*R*
4 series · 4 of 4 positions shown · non-contrast
Comparison: None

CLINICAL DATA: RIGHT wrist pain and burning for 2 months from
forearm and to hand, greatest at distal forearm and wrist

EXAM:
RIGHT WRIST - COMPLETE 3+ VIEW

[wrist pa]
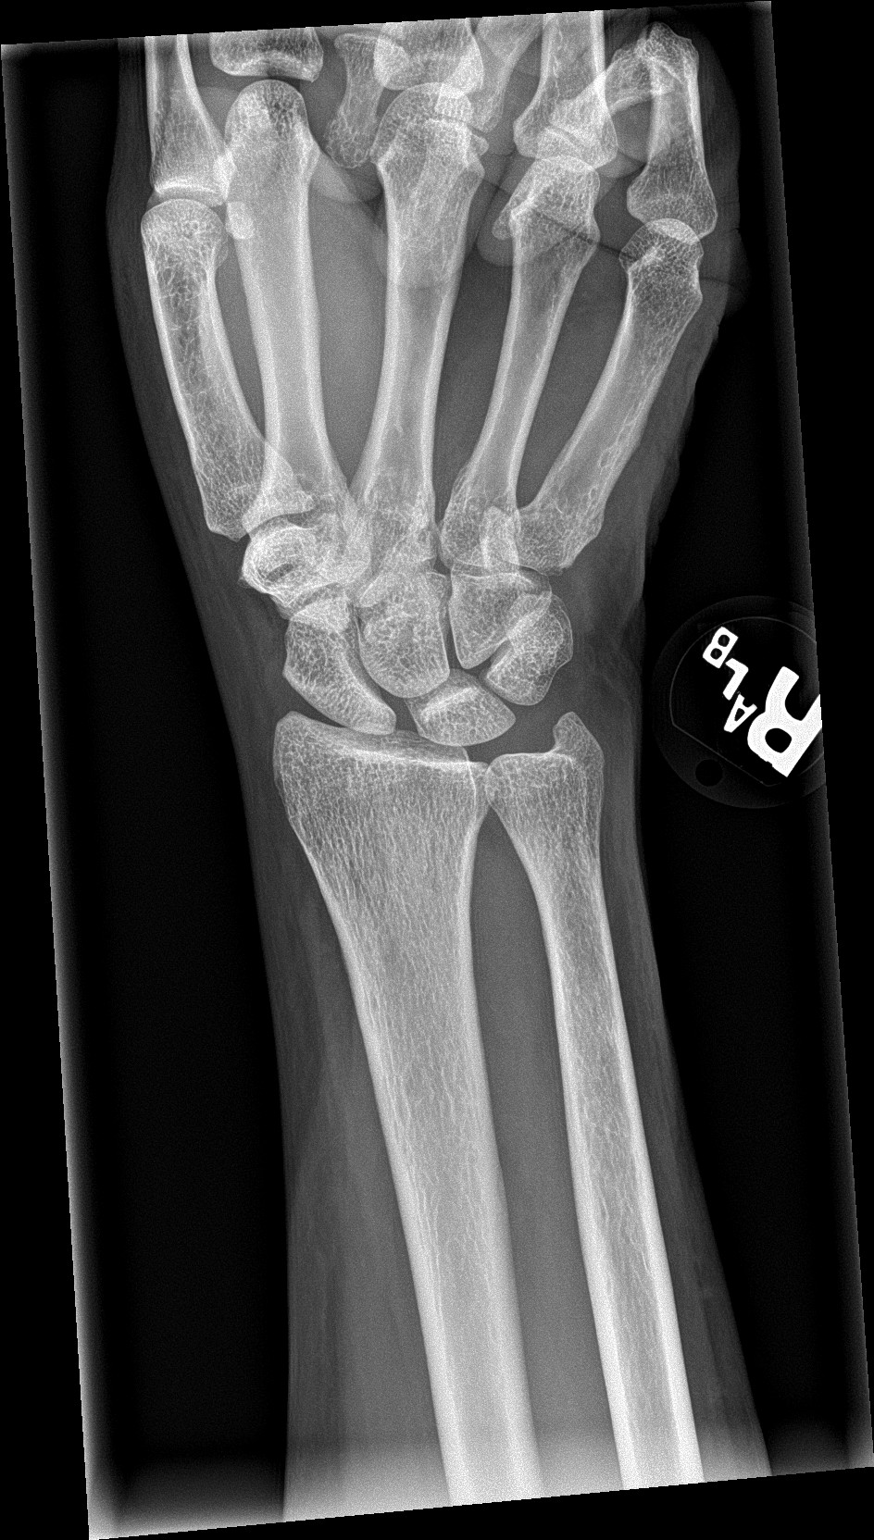

[wrist obl]
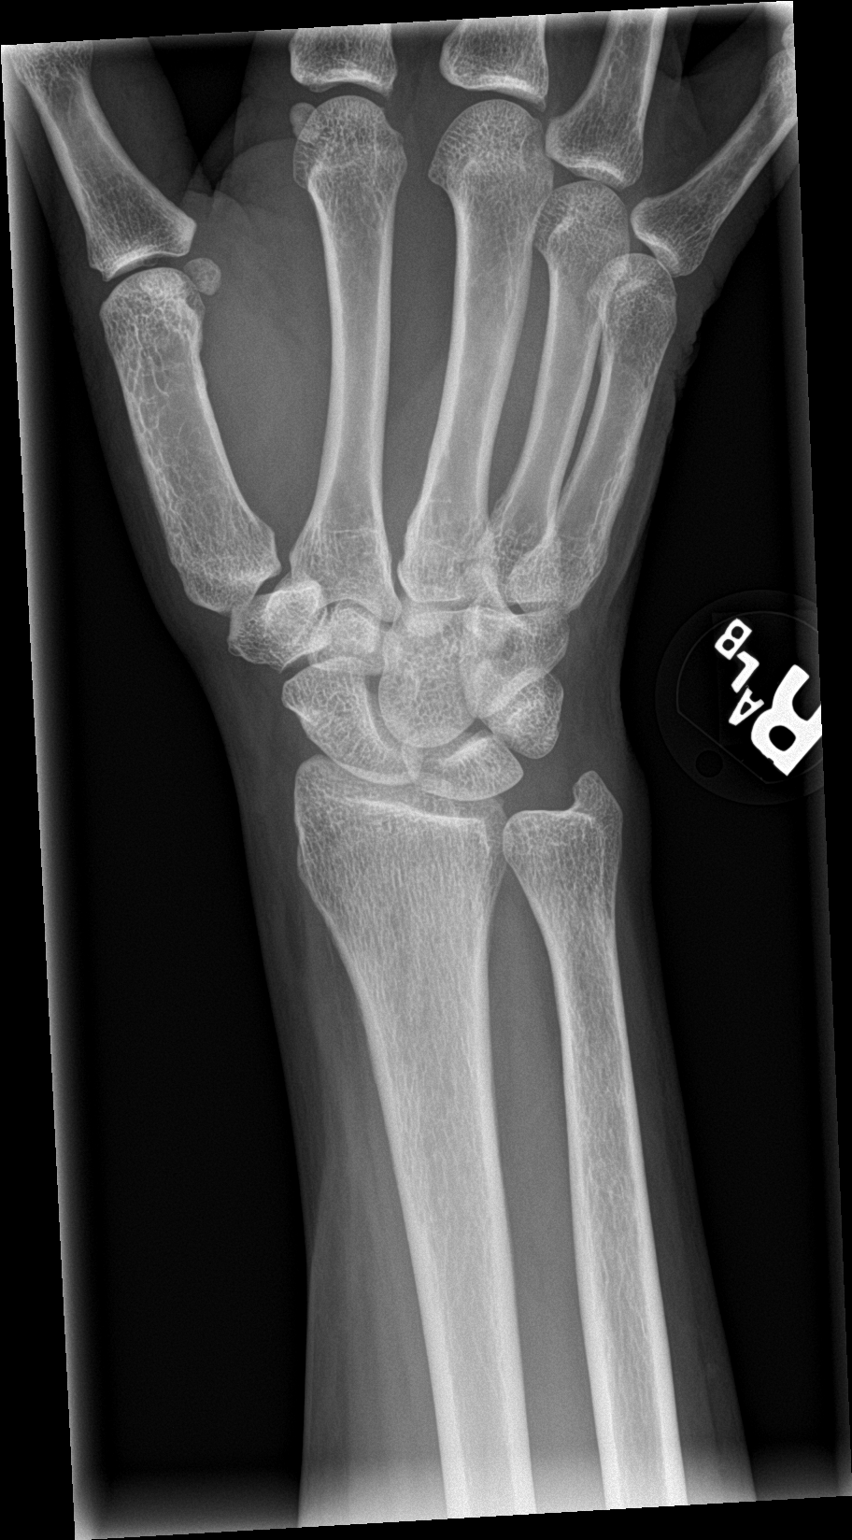

[wrist lat]
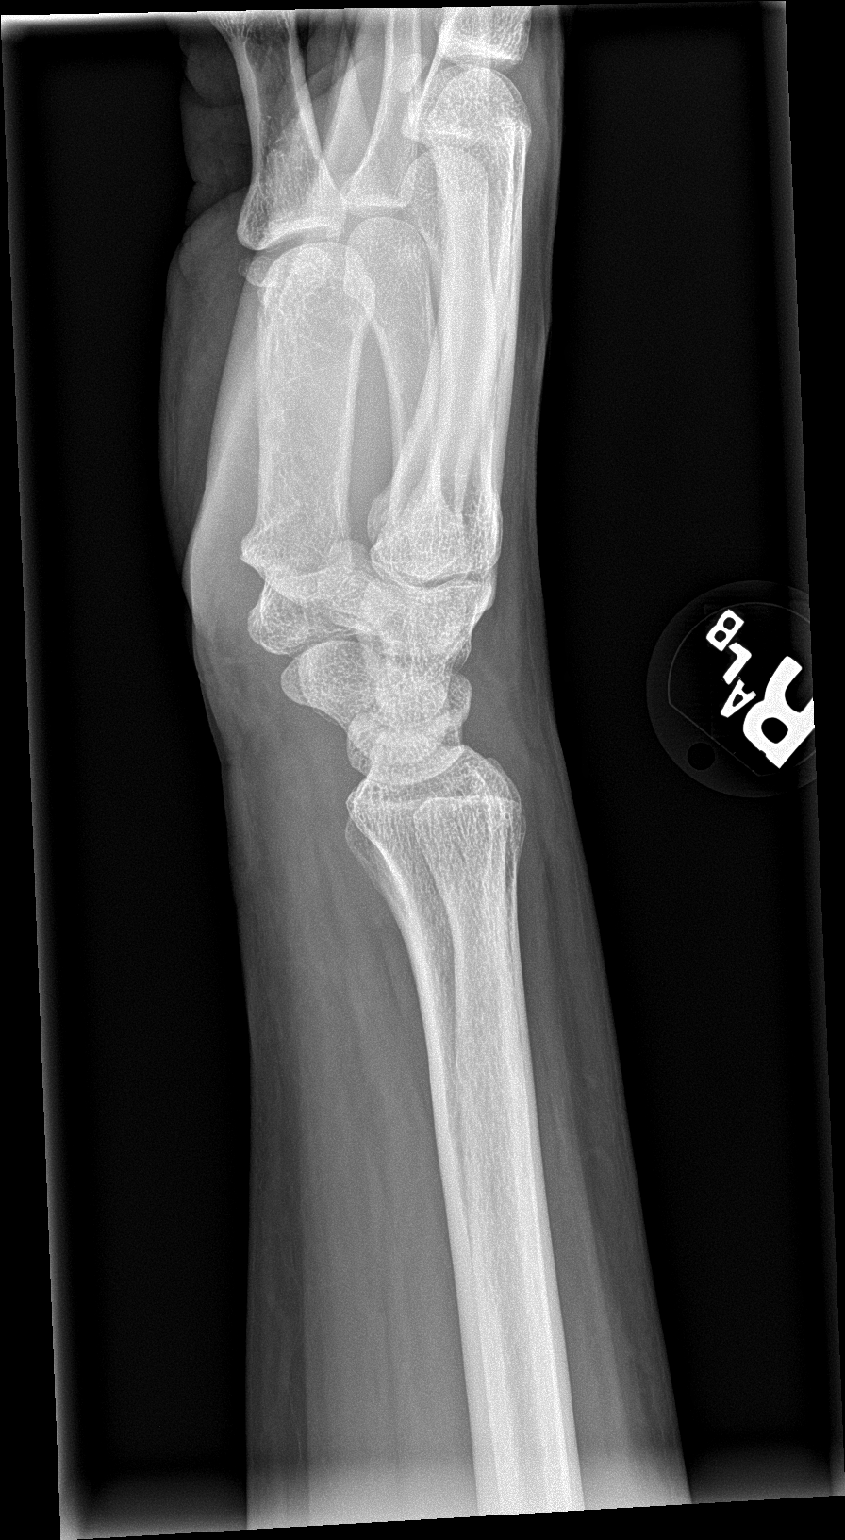

[wrist navicular]
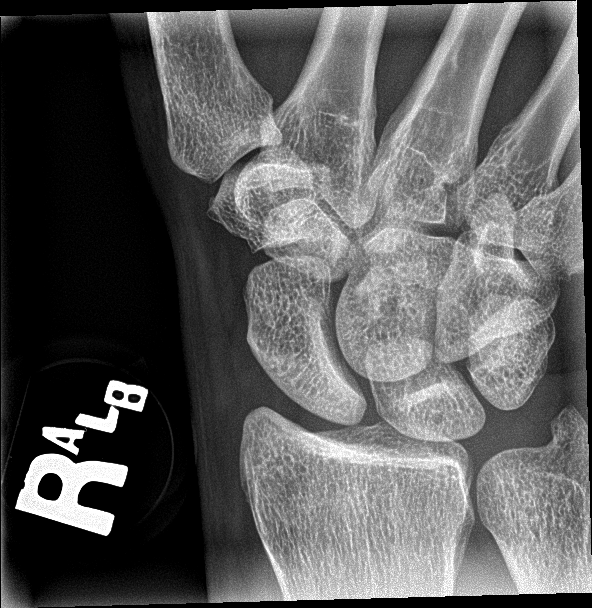

[4 of 4 positions shown; findings below may reference images not displayed]

FINDINGS: Soft tissue swelling at wrist.

Osseous mineralization normal.

Joint spaces preserved.

No fracture, dislocation, or bone destruction.
IMPRESSION: No acute osseous abnormalities.

## 2019-07-28 ENCOUNTER — Other Ambulatory Visit: Payer: Self-pay

## 2019-07-28 DIAGNOSIS — Z20822 Contact with and (suspected) exposure to covid-19: Secondary | ICD-10-CM

## 2019-07-29 LAB — NOVEL CORONAVIRUS, NAA: SARS-CoV-2, NAA: NOT DETECTED

## 2019-07-30 ENCOUNTER — Telehealth: Payer: Self-pay | Admitting: General Practice

## 2019-07-30 NOTE — Telephone Encounter (Signed)
Pt received negative covid results

## 2020-10-27 ENCOUNTER — Ambulatory Visit (INDEPENDENT_AMBULATORY_CARE_PROVIDER_SITE_OTHER): Payer: BLUE CROSS/BLUE SHIELD

## 2020-10-27 ENCOUNTER — Ambulatory Visit (INDEPENDENT_AMBULATORY_CARE_PROVIDER_SITE_OTHER): Payer: BLUE CROSS/BLUE SHIELD | Admitting: Podiatry

## 2020-10-27 ENCOUNTER — Encounter: Payer: Self-pay | Admitting: Podiatry

## 2020-10-27 ENCOUNTER — Other Ambulatory Visit: Payer: Self-pay

## 2020-10-27 DIAGNOSIS — M779 Enthesopathy, unspecified: Secondary | ICD-10-CM

## 2020-10-27 DIAGNOSIS — M778 Other enthesopathies, not elsewhere classified: Secondary | ICD-10-CM

## 2020-10-28 ENCOUNTER — Encounter: Payer: Self-pay | Admitting: Podiatry

## 2020-10-28 NOTE — Progress Notes (Signed)
Subjective:  Patient ID: Karen Anthony, female    DOB: 03-10-1961,  MRN: [id number]  Chief Complaint  Patient presents with  . Foot Pain  . Nail Problem    59 y.o. female presents with the above complaint.  Patient presents with complaint of left dorsal forefoot pain.  Patient states is been painful for quite some time.  She states that the top of the foot has been turning a little black and start about 2 weeks ago there is some swelling.  She denies any injuries.  She states now starting to hurt at the base of the toes.  She would like to discuss treatment options.  She has not seen anyone else prior to seeing me.  She denies any other acute complaints.   Review of Systems: Negative except as noted in the HPI. Denies N/V/F/Ch.  Past Medical History:  Diagnosis Date  . Anemia    Hx  . Arthritis    knees, hands  . Diabetes mellitus without complication ([place])   . Hypertension   . Palpitations   . Sinus congestion   . Uterine fibroid   . Wears dentures    full upper    Current Outpatient Medications:  .  amLODipine (NORVASC) 5 MG tablet, Take 5 mg by mouth daily., Disp: , Rfl:  .  FARXIGA 10 MG TABS tablet, Take 10 mg by mouth daily., Disp: , Rfl:  .  lisinopril (PRINIVIL,ZESTRIL) 20 MG tablet, Take 20 mg by mouth every morning. , Disp: , Rfl: 1 .  metFORMIN (GLUCOPHAGE) 500 MG tablet, Take 500 mg by mouth 2 (two) times daily with a meal., Disp: , Rfl:  .  RYBELSUS 14 MG TABS, Take 1 tablet by mouth every morning., Disp: , Rfl:   Social History   Tobacco Use  Smoking Status Never Smoker  Smokeless Tobacco Never Used    No Known Allergies Objective:  There were no vitals filed for this visit. There is no height or weight on file to calculate BMI. Constitutional Well developed. Well nourished.  Vascular Dorsalis pedis pulses palpable bilaterally. Posterior tibial pulses palpable bilaterally. Capillary refill normal to all digits.  No cyanosis or clubbing  noted. Pedal hair growth normal.  Neurologic Normal speech. Oriented to person, place, and time. Epicritic sensation to light touch grossly present bilaterally.  Dermatologic Nails well groomed and normal in appearance. No open wounds. No skin lesions.  Orthopedic:  Pain on palpation to the left dorsal foot.  Pain with resisted dorsiflexion of the digit.  No pain with resisted plantarflexion of the digit.  Pain on palpation of the metatarsal head 2 through 4.  No pain on the plantar side of it.  No signs of metatarsalgia noted.  No signs of Mulder click or neuroma is noted.  No pain at the tarsometatarsal joints.   Radiographs: 3 views of skeletally mature adult bilateral foot: No fractures noted.  No osseous abnormalities noted.  Mild posterior heel spurring noted.  No osteoarthrosis noted. Assessment:   1. Tendinitis   2. Extensor tendinitis of foot    Plan:  Patient was evaluated and treated and all questions answered.  Left extensor tendinitis -I explained to the patient the etiology of tendinitis and various treatment options were extensively discussed.  Given the amount of pain that she is having I believe she will benefit from a steroid injection to help decrease acute inflammatory component associated with pain.  If there is no relief we will discuss cam boot immobilization.  At this time patient does not want to undergo cam boot immobilization.  No follow-ups on file.

## 2021-01-05 ENCOUNTER — Other Ambulatory Visit: Payer: Self-pay | Admitting: Podiatry

## 2021-01-05 DIAGNOSIS — M778 Other enthesopathies, not elsewhere classified: Secondary | ICD-10-CM

## 2022-03-27 ENCOUNTER — Other Ambulatory Visit: Payer: Self-pay

## 2022-03-27 MED ORDER — OLMESARTAN MEDOXOMIL 40 MG PO TABS
ORAL_TABLET | ORAL | 2 refills | Status: DC
  Filled 2022-03-27: qty 30, 30d supply, fill #0

## 2022-10-03 ENCOUNTER — Other Ambulatory Visit: Payer: Self-pay | Admitting: Internal Medicine

## 2022-10-03 DIAGNOSIS — Z1231 Encounter for screening mammogram for malignant neoplasm of breast: Secondary | ICD-10-CM

## 2023-02-13 ENCOUNTER — Other Ambulatory Visit: Payer: Self-pay | Admitting: General Practice

## 2023-02-15 ENCOUNTER — Ambulatory Visit: Payer: Medicaid Other | Admitting: Internal Medicine

## 2023-02-15 MED ORDER — OZEMPIC (1 MG/DOSE) 4 MG/3ML ~~LOC~~ SOPN: 1.0000 mg | PEN_INJECTOR | SUBCUTANEOUS | 0 refills | Status: DC

## 2023-02-22 ENCOUNTER — Ambulatory Visit: Payer: Medicaid Other | Admitting: Internal Medicine

## 2023-03-11 ENCOUNTER — Other Ambulatory Visit: Payer: Medicaid Other

## 2023-03-11 ENCOUNTER — Other Ambulatory Visit: Payer: Self-pay | Admitting: Internal Medicine

## 2023-03-12 LAB — LIPID PANEL W/O CHOL/HDL RATIO
Cholesterol, Total: 186 mg/dL (ref 100–199)
HDL: 60 mg/dL (ref >39)
LDL Chol Calc (NIH): 113 mg/dL — ABNORMAL HIGH (ref 0–99)
Triglycerides: 71 mg/dL (ref 0–149)
VLDL Cholesterol Cal: 13 mg/dL (ref 5–40)

## 2023-03-12 LAB — VITAMIN D 25 HYDROXY (VIT D DEFICIENCY, FRACTURES): Vit D, 25-Hydroxy: 32.8 ng/mL (ref 30.0–100.0)

## 2023-03-12 LAB — HGB A1C W/O EAG: Hgb A1c MFr Bld: 6 % — ABNORMAL HIGH (ref 4.8–5.6)

## 2023-03-13 ENCOUNTER — Encounter: Payer: Self-pay | Admitting: Internal Medicine

## 2023-03-13 ENCOUNTER — Ambulatory Visit: Payer: Medicaid Other | Admitting: Internal Medicine

## 2023-03-13 VITALS — BP 152/78 | HR 113 | Temp 98.0°F | Ht 71.0 in | Wt 232.0 lb

## 2023-03-13 DIAGNOSIS — E119 Type 2 diabetes mellitus without complications: Secondary | ICD-10-CM | POA: Diagnosis not present

## 2023-03-13 DIAGNOSIS — E782 Mixed hyperlipidemia: Secondary | ICD-10-CM | POA: Diagnosis not present

## 2023-03-13 DIAGNOSIS — I10 Essential (primary) hypertension: Secondary | ICD-10-CM

## 2023-03-13 DIAGNOSIS — E669 Obesity, unspecified: Secondary | ICD-10-CM

## 2023-03-13 LAB — POCT CBG (FASTING - GLUCOSE)-MANUAL ENTRY: Glucose Fasting, POC: 174 mg/dL — AB (ref 70–99)

## 2023-03-13 MED ORDER — OZEMPIC (2 MG/DOSE) 8 MG/3ML ~~LOC~~ SOPN: 2.0000 mg | PEN_INJECTOR | SUBCUTANEOUS | 2 refills | Status: AC

## 2023-03-13 MED ORDER — LIVALO 2 MG PO TABS: 4.0000 mg | ORAL_TABLET | Freq: Every evening | ORAL | 0 refills | Status: AC

## 2023-03-13 NOTE — Progress Notes (Signed)
Established Patient Office Visit  Subjective:  Patient ID: Karen Anthony, female    DOB: Aug 23, 1961  Age: 62 y.o. MRN: [id number]  Chief Complaint  Patient presents with   Follow-up    3 month follow up    No new complaints, here for lab review and medication refills. Labs reviewed and notable for well controlled diabetes, A1c at target but  lipids notable for deterioration in LDL with unremarkable cmp. Denies any hypoglycemic episodes and home bg readings have been at target.      No other concerns at this time.   Past Medical History:  Diagnosis Date   Anemia    Hx   Arthritis    knees, hands   Diabetes mellitus without complication (HCC)    Hypertension    Palpitations    Sinus congestion    Uterine fibroid    Wears dentures    full upper    Past Surgical History:  Procedure Laterality Date   APPENDECTOMY     CESAREAN SECTION     3 times   COLONOSCOPY WITH PROPOFOL N/A 09/03/2016   Procedure: COLONOSCOPY WITH PROPOFOL;  Surgeon: Lucilla Lame, MD;  Location: [place];  Service: Endoscopy;  Laterality: N/A;   ESOPHAGOGASTRODUODENOSCOPY (EGD) WITH PROPOFOL N/A 09/03/2016   Procedure: ESOPHAGOGASTRODUODENOSCOPY (EGD) WITH PROPOFOL;  Surgeon: Lucilla Lame, MD;  Location: [place];  Service: Endoscopy;  Laterality: N/A;  Diabetic - oral meds   UTERINE FIBROID SURGERY      Social History   Socioeconomic History   Marital status: Legally Separated    Spouse name: Not on file   Number of children: Not on file   Years of education: Not on file   Highest education level: Not on file  Occupational History   Not on file  Tobacco Use   Smoking status: Never   Smokeless tobacco: Never  Vaping Use   Vaping Use: Never used  Substance and Sexual Activity   Alcohol use: No   Drug use: No   Sexual activity: Not Currently    Birth control/protection: Post-menopausal  Other Topics Concern   Not on file  Social History Narrative   Not on file    Social Determinants of Health   Financial Resource Strain: Not on file  Food Insecurity: Not on file  Transportation Needs: Not on file  Physical Activity: Not on file  Stress: Not on file  Social Connections: Not on file  Intimate Partner Violence: Not on file    Family History  Problem Relation Age of Onset   Diabetes Mother    Hypertension Mother    Diabetes Father    Hypertension Father    Colon cancer Sister     No Known Allergies  Review of Systems  Constitutional: Negative.        Gained 4 lbs  HENT: Negative.    Eyes: Negative.   Respiratory: Negative.    Cardiovascular: Negative.   Gastrointestinal: Negative.   Genitourinary: Negative.   Skin: Negative.   Neurological: Negative.   Endo/Heme/Allergies: Negative.        Objective:   BP (!) 152/78   Pulse (!) 113   Temp 98 F (36.7 C) (Tympanic)   Ht 5\' 11"  (1.803 m)   Wt 232 lb (105.2 kg)   SpO2 97%   BMI 32.36 kg/m   Vitals:   03/13/23 1024  BP: (!) 152/78  Pulse: (!) 113  Temp: 98 F (36.7 C)  Height: 5\' 11"  (1.803  m)  Weight: 232 lb (105.2 kg)  SpO2: 97%  TempSrc: Tympanic  BMI (Calculated): 32.37    Physical Exam Vitals reviewed.  Constitutional:      General: She is not in acute distress.    Appearance: She is obese.  HENT:     Head: Normocephalic.     Nose: Nose normal.     Mouth/Throat:     Mouth: Mucous membranes are moist.  Eyes:     Extraocular Movements: Extraocular movements intact.     Pupils: Pupils are equal, round, and reactive to light.  Cardiovascular:     Rate and Rhythm: Normal rate and regular rhythm.     Heart sounds: No murmur heard. Pulmonary:     Effort: Pulmonary effort is normal.     Breath sounds: No rhonchi or rales.  Abdominal:     General: Abdomen is flat.     Palpations: There is no hepatomegaly, splenomegaly or mass.  Musculoskeletal:        General: Normal range of motion.     Cervical back: Normal range of motion. No tenderness.   Skin:    General: Skin is warm and dry.  Neurological:     General: No focal deficit present.     Mental Status: She is alert and oriented to person, place, and time.     Cranial Nerves: No cranial nerve deficit.     Motor: No weakness.  Psychiatric:        Mood and Affect: Mood normal.        Behavior: Behavior normal.      Results for orders placed or performed in visit on 03/13/23  POCT CBG (Fasting - Glucose)  Result Value Ref Range   Glucose Fasting, POC 174 (A) 70 - 99 mg/dL    Recent Results (from the past 2160 hour(Deitrick Ferreri))  Lipid Panel w/o Chol/HDL Ratio     Status: Abnormal   Collection Time: 03/11/23 10:53 AM  Result Value Ref Range   Cholesterol, Total 186 100 - 199 mg/dL   Triglycerides 71 0 - 149 mg/dL   HDL 60 >39 mg/dL   VLDL Cholesterol Cal 13 5 - 40 mg/dL   LDL Chol Calc (NIH) 113 (H) 0 - 99 mg/dL  Hgb A1c w/o eAG     Status: Abnormal   Collection Time: 03/11/23 10:53 AM  Result Value Ref Range   Hgb A1c MFr Bld 6.0 (H) 4.8 - 5.6 %    Comment:          Prediabetes: 5.7 - 6.4          Diabetes: >6.4          Glycemic control for adults with diabetes: <7.0   VITAMIN D 25 Hydroxy (Vit-D Deficiency, Fractures)     Status: None   Collection Time: 03/11/23 10:53 AM  Result Value Ref Range   Vit D, 25-Hydroxy 32.8 30.0 - 100.0 ng/mL    Comment: Vitamin D deficiency has been defined by the [place] and an Endocrine Society practice guideline as a level of serum 25-OH vitamin D less than 20 ng/mL (1,2). The Endocrine Society went on to further define vitamin D insufficiency as a level between 21 and 29 ng/mL (2). 1. IOM (Institute of Medicine). 2010. Dietary reference    intakes for calcium and D. [place]: The    [employer]. 2. Holick MF, Binkley [place], Bischoff-Ferrari HA, et al.    Evaluation, treatment, and prevention of vitamin D  deficiency: an Endocrine Society clinical practice    guideline. JCEM. 2011 Jul;  96(7):1911-30.   POCT CBG (Fasting - Glucose)     Status: Abnormal   Collection Time: 03/13/23 10:33 AM  Result Value Ref Range   Glucose Fasting, POC 174 (A) 70 - 99 mg/dL      Assessment & Plan:   Problem List Items Addressed This Visit       Endocrine   Type 2 diabetes mellitus (HCC) - Primary   Relevant Medications   pioglitazone (ACTOS) 30 MG tablet   JARDIANCE 25 MG TABS tablet   Semaglutide, 2 MG/DOSE, (OZEMPIC, 2 MG/DOSE,) 8 MG/3ML SOPN   LIVALO 2 MG TABS   Other Relevant Orders   POCT CBG (Fasting - Glucose) (Completed)   Hemoglobin A1c   Other Visit Diagnoses     Obesity (BMI 30.0-34.9)       Primary hypertension       Relevant Medications   LIVALO 2 MG TABS   Other Relevant Orders   Lipid panel   Comprehensive metabolic panel   Mixed hyperlipidemia       Relevant Medications   LIVALO 2 MG TABS   Other Relevant Orders   Lipid panel   Comprehensive metabolic panel      Increase Livalo to 4 mg daily. No follow-ups on file.   Total time spent: 30 minutes  Volanda Napoleon, MD  03/13/2023

## 2023-06-14 ENCOUNTER — Ambulatory Visit: Payer: Medicaid Other | Admitting: Internal Medicine

## 2024-01-30 ENCOUNTER — Telehealth: Payer: Self-pay | Admitting: Internal Medicine

## 2024-01-30 NOTE — Telephone Encounter (Signed)
 Pt came in stating she had missed some appts due to lack of insurance. She now has insurance and would like for her METFORMIN  500mg  & AMLODIPINE  10mg  & OZEMPIC   [facility] still good pharm Advised pt that Tj would probably like to see her in an apt to refill these meds, and she stated that she would come in tomorrow to get labs done but wouldn't have time to come in for an appt Please advise

## 2024-02-03 ENCOUNTER — Other Ambulatory Visit: Payer: Self-pay

## 2024-02-03 MED ORDER — AMLODIPINE-OLMESARTAN 10-40 MG PO TABS: 1.0000 | ORAL_TABLET | Freq: Every morning | ORAL | 0 refills | Status: AC

## 2024-02-03 MED ORDER — OZEMPIC (1 MG/DOSE) 4 MG/3ML ~~LOC~~ SOPN: 1.0000 mg | PEN_INJECTOR | SUBCUTANEOUS | 0 refills | Status: AC

## 2024-02-03 MED ORDER — METFORMIN HCL 500 MG PO TABS: 500.0000 mg | ORAL_TABLET | Freq: Two times a day (BID) | ORAL | 0 refills | Status: AC
